# Patient Record
Sex: Female | Born: 1995
Health system: Southern US, Community
[De-identification: ages and names within clinical notes are randomized; demographics above are authoritative.]

## PROBLEM LIST (undated history)

## (undated) DIAGNOSIS — IMO0001 Reserved for inherently not codable concepts without codable children: Secondary | ICD-10-CM

## (undated) DIAGNOSIS — F17200 Nicotine dependence, unspecified, uncomplicated: Secondary | ICD-10-CM

## (undated) DIAGNOSIS — F458 Other somatoform disorders: Secondary | ICD-10-CM

## (undated) HISTORY — DX: Nicotine dependence, unspecified, uncomplicated: F17.200

## (undated) HISTORY — PX: NO PAST SURGERIES: SHX2092

## (undated) HISTORY — DX: Other somatoform disorders: F45.8

## (undated) HISTORY — DX: Reserved for inherently not codable concepts without codable children: IMO0001

---
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
## 2016-02-22 DIAGNOSIS — R197 Diarrhea, unspecified: Secondary | ICD-10-CM | POA: Diagnosis not present

## 2016-02-22 DIAGNOSIS — R1084 Generalized abdominal pain: Secondary | ICD-10-CM | POA: Diagnosis not present

## 2016-03-09 ENCOUNTER — Ambulatory Visit (INDEPENDENT_AMBULATORY_CARE_PROVIDER_SITE_OTHER): Payer: Self-pay | Admitting: Internal Medicine

## 2016-03-09 ENCOUNTER — Encounter: Payer: Self-pay | Admitting: Internal Medicine

## 2016-03-09 VITALS — BP 118/86 | HR 86 | Resp 16 | Ht 67.0 in | Wt 147.0 lb

## 2016-03-09 DIAGNOSIS — F17200 Nicotine dependence, unspecified, uncomplicated: Secondary | ICD-10-CM

## 2016-03-09 DIAGNOSIS — K589 Irritable bowel syndrome without diarrhea: Secondary | ICD-10-CM

## 2016-03-09 NOTE — Progress Notes (Signed)
    Date:  03/09/2016   Name:  Tammy Pena   DOB:  08/17/1996   MRN:  [id number]   Chief Complaint: Establish Care Diarrhea  This is a chronic problem. The current episode started more than 1 year ago (many years). The problem has been waxing and waning. Associated symptoms include bloating and increased flatus. Pertinent negatives include no abdominal pain, chills, coughing, fever or weight loss. Exacerbated by: beef.  She thinks she had IBS as symptoms have been occuring for years.  [facility] but has not received the results.  Review of Systems  Constitutional: Negative for fever, chills, weight loss, diaphoresis and unexpected weight change.  Respiratory: Negative for cough, chest tightness, shortness of breath and wheezing.   Cardiovascular: Negative for chest pain, palpitations and leg swelling.  Gastrointestinal: Positive for diarrhea, abdominal distention, bloating and flatus. Negative for abdominal pain and blood in stool.    There are no active problems to display for this patient.   Prior to Admission medications   Medication Sig Start Date End Date Taking? Authorizing Provider  Norgestimate-Ethinyl Estradiol Triphasic (TRI-SPRINTEC) 0.18/0.215/0.25 MG-35 MCG tablet Take 1 tablet by mouth daily.   Yes Historical Provider, MD    No Known Allergies  History reviewed. No pertinent past surgical history.  Social History  Substance Use Topics  . Smoking status: Current Every Day Smoker -- 0.25 packs/day    Types: Cigarettes  . Smokeless tobacco: None  . Alcohol Use: No     Medication list has been reviewed and updated.   Physical Exam  Constitutional: She is oriented to person, place, and time. She appears well-developed. No distress.  HENT:  Head: Normocephalic and atraumatic.  Neck: Normal range of motion. Neck supple. No thyromegaly present.  Cardiovascular: Normal rate, regular rhythm and normal heart sounds.     Pulmonary/Chest: Effort normal and breath sounds normal. No respiratory distress. She has no wheezes.  Abdominal: Soft. Bowel sounds are normal. She exhibits no distension and no mass. There is no tenderness. There is no rebound and no guarding.  Musculoskeletal: Normal range of motion. She exhibits no edema or tenderness.  Neurological: She is alert and oriented to person, place, and time.  Skin: Skin is warm and dry. No rash noted.  Psychiatric: She has a normal mood and affect. Her behavior is normal. Thought content normal.  Nursing note and vitals reviewed.   BP 118/86 mmHg  Pulse 86  Resp 16  Ht 5\' 7"  (1.702 m)  Wt 147 lb (66.679 kg)  BMI 23.02 kg/m2  SpO2 100%  LMP 02/26/2016  Assessment and Plan: 1. Tobacco use disorder She is not ready to quit at this time due to multiple recent stressors  2. Irritable bowel syndrome (IBS) Forward labs to me for review Try daily fiber supplement to help control symptoms   Tammy EdwardLaura Raneem Mendolia, MD [facility]  03/09/2016

## 2016-03-09 NOTE — Patient Instructions (Signed)
Metamucil or Citrucel - (smooth formula)

## 2016-05-18 ENCOUNTER — Encounter: Payer: Self-pay | Admitting: Internal Medicine

## 2016-05-18 ENCOUNTER — Ambulatory Visit (INDEPENDENT_AMBULATORY_CARE_PROVIDER_SITE_OTHER): Payer: BLUE CROSS/BLUE SHIELD | Admitting: Internal Medicine

## 2016-05-18 VITALS — BP 112/68 | HR 84 | Resp 16 | Ht 67.0 in | Wt 143.0 lb

## 2016-05-18 DIAGNOSIS — K589 Irritable bowel syndrome without diarrhea: Secondary | ICD-10-CM

## 2016-05-18 DIAGNOSIS — Z124 Encounter for screening for malignant neoplasm of cervix: Secondary | ICD-10-CM

## 2016-05-18 DIAGNOSIS — Z720 Tobacco use: Secondary | ICD-10-CM | POA: Diagnosis not present

## 2016-05-18 DIAGNOSIS — Z0000 Encounter for general adult medical examination without abnormal findings: Secondary | ICD-10-CM | POA: Diagnosis not present

## 2016-05-18 DIAGNOSIS — F17200 Nicotine dependence, unspecified, uncomplicated: Secondary | ICD-10-CM | POA: Insufficient documentation

## 2016-05-18 LAB — POCT URINALYSIS DIPSTICK
Bilirubin, UA: NEGATIVE
Blood, UA: NEGATIVE
Glucose, UA: NEGATIVE
Ketones, UA: NEGATIVE
Leukocytes, UA: NEGATIVE
Nitrite, UA: NEGATIVE
Protein, UA: NEGATIVE
Spec Grav, UA: 1.01
pH, UA: 7.5

## 2016-05-18 NOTE — Patient Instructions (Signed)
Breast Self-Awareness Practicing breast self-awareness may pick up problems early, prevent significant medical complications, and possibly save your life. By practicing breast self-awareness, you can become familiar with how your breasts look and feel and if your breasts are changing. This allows you to notice changes early. It can also offer you some reassurance that your breast health is good. One way to learn what is normal for your breasts and whether your breasts are changing is to do a breast self-exam. If you find a lump or something that was not present in the past, it is best to contact your caregiver right away. Other findings that should be evaluated by your caregiver include nipple discharge, especially if it is bloody; skin changes or reddening; areas where the skin seems to be pulled in (retracted); or new lumps and bumps. Breast pain is seldom associated with cancer (malignancy), but should also be evaluated by a caregiver. HOW TO PERFORM A BREAST SELF-EXAM The best time to examine your breasts is 5-7 days after your menstrual period is over. During menstruation, the breasts are lumpier, and it may be more difficult to pick up changes. If you do not menstruate, have reached menopause, or had your uterus removed (hysterectomy), you should examine your breasts at regular intervals, such as monthly. If you are breastfeeding, examine your breasts after a feeding or after using a breast pump. Breast implants do not decrease the risk for lumps or tumors, so continue to perform breast self-exams as recommended. Talk to your caregiver about how to determine the difference between the implant and breast tissue. Also, talk about the amount of pressure you should use during the exam. Over time, you will become more familiar with the variations of your breasts and more comfortable with the exam. A breast self-exam requires you to remove all your clothes above the waist. 1. Look at your breasts and nipples.  Stand in front of a mirror in a room with good lighting. With your hands on your hips, push your hands firmly downward. Look for a difference in shape, contour, and size from one breast to the other (asymmetry). Asymmetry includes puckers, dips, or bumps. Also, look for skin changes, such as reddened or scaly areas on the breasts. Look for nipple changes, such as discharge, dimpling, repositioning, or redness. 2. Carefully feel your breasts. This is best done either in the shower or tub while using soapy water or when flat on your back. Place the arm (on the side of the breast you are examining) above your head. Use the pads (not the fingertips) of your three middle fingers on your opposite hand to feel your breasts. Start in the underarm area and use  inch (2 cm) overlapping circles to feel your breast. Use 3 different levels of pressure (light, medium, and firm pressure) at each circle before moving to the next circle. The light pressure is needed to feel the tissue closest to the skin. The medium pressure will help to feel breast tissue a little deeper, while the firm pressure is needed to feel the tissue close to the ribs. Continue the overlapping circles, moving downward over the breast until you feel your ribs below your breast. Then, move one finger-width towards the center of the body. Continue to use the  inch (2 cm) overlapping circles to feel your breast as you move slowly up toward the collar bone (clavicle) near the base of the neck. Continue the up and down exam using all 3 pressures until you reach the   middle of the chest. Do this with each breast, carefully feeling for lumps or changes. 3.  Keep a written record with breast changes or normal findings for each breast. By writing this information down, you do not need to depend only on memory for size, tenderness, or location. Write down where you are in your menstrual cycle, if you are still menstruating. Breast tissue can have some lumps or  thick tissue. However, see your caregiver if you find anything that concerns you.  SEEK MEDICAL CARE IF:  You see a change in shape, contour, or size of your breasts or nipples.   You see skin changes, such as reddened or scaly areas on the breasts or nipples.   You have an unusual discharge from your nipples.   You feel a new lump or unusually thick areas.    This information is not intended to replace advice given to you by your health care provider. Make sure you discuss any questions you have with your health care provider.   Document Released: 09/17/2005 Document Revised: 09/03/2012 Document Reviewed: 01/02/2012 Elsevier Interactive Patient Education 2016 [employer]  

## 2016-05-18 NOTE — Progress Notes (Signed)
Date:  05/18/2016   Name:  Tammy GhaziDanielle L Pena   DOB:  08/11/1996   MRN:  [id number]   Chief Complaint: Annual Exam (Pap aldo since can not reach GYn)  Tammy Pena is a 20 y.o. female who presents today for her Complete Annual Exam. She feels well. She reports exercising regularly. She reports she is sleeping well. No breast problems.  Review of Systems  Constitutional: Negative for chills, fatigue and fever.  HENT: Negative for congestion, hearing loss, tinnitus, trouble swallowing and voice change.   Eyes: Negative for visual disturbance.  Respiratory: Negative for cough, chest tightness, shortness of breath and wheezing.   Cardiovascular: Negative for chest pain, palpitations and leg swelling.  Gastrointestinal: Negative for abdominal pain, constipation, diarrhea and vomiting.  Endocrine: Negative for polydipsia and polyuria.  Genitourinary: Negative for dysuria, frequency, genital sores, vaginal bleeding and vaginal discharge.  Musculoskeletal: Negative for arthralgias, gait problem and joint swelling.  Skin: Negative for color change and rash.  Neurological: Negative for dizziness, tremors, light-headedness and headaches.  Hematological: Negative for adenopathy. Does not bruise/bleed easily.  Psychiatric/Behavioral: Negative for dysphoric mood and sleep disturbance. The patient is not nervous/anxious.     Patient Active Problem List   Diagnosis Date Noted  . Irritable bowel syndrome (IBS) 05/18/2016  . Smoker 05/18/2016    Prior to Admission medications   Medication Sig Start Date End Date Taking? Authorizing Provider  Norgestimate-Ethinyl Estradiol Triphasic (TRI-SPRINTEC) 0.18/0.215/0.25 MG-35 MCG tablet Take 1 tablet by mouth daily.   Yes Historical Provider, MD    No Known Allergies  History reviewed. No pertinent surgical history.  Social History  Substance Use Topics  . Smoking status: Current Every Day Smoker    Packs/day: 0.25    Years: 5.00    Types:  Cigarettes  . Smokeless tobacco: Never Used  . Alcohol use No     Medication list has been reviewed and updated.   Physical Exam  Constitutional: She is oriented to person, place, and time. She appears well-developed and well-nourished. No distress.  HENT:  Head: Normocephalic and atraumatic.  Right Ear: Tympanic membrane and ear canal normal.  Left Ear: Tympanic membrane and ear canal normal.  Nose: Right sinus exhibits no maxillary sinus tenderness. Left sinus exhibits no maxillary sinus tenderness.  Mouth/Throat: Uvula is midline and oropharynx is clear and moist.  Eyes: Conjunctivae and EOM are normal. Right eye exhibits no discharge. Left eye exhibits no discharge. No scleral icterus.  Neck: Normal range of motion. Carotid bruit is not present. No erythema present. No thyromegaly present.  Cardiovascular: Normal rate, regular rhythm, normal heart sounds and normal pulses.   Pulmonary/Chest: Effort normal. No respiratory distress. She has no wheezes. Right breast exhibits no mass, no nipple discharge, no skin change and no tenderness. Left breast exhibits no mass, no nipple discharge, no skin change and no tenderness.  Abdominal: Soft. Bowel sounds are normal. There is no hepatosplenomegaly. There is no tenderness. There is no CVA tenderness.  Genitourinary: Vagina normal. There is no tenderness, lesion or injury on the right labia. There is no tenderness, lesion or injury on the left labia. Cervix exhibits friability. Cervix exhibits no motion tenderness and no discharge. Right adnexum displays no mass, no tenderness and no fullness. Left adnexum displays no mass, no tenderness and no fullness.  Musculoskeletal: Normal range of motion.  Lymphadenopathy:    She has no cervical adenopathy.    She has no axillary adenopathy.  Neurological: She is alert  and oriented to person, place, and time. She has normal reflexes. No cranial nerve deficit or sensory deficit.  Skin: Skin is warm, dry  and intact. No rash noted.  Psychiatric: She has a normal mood and affect. Her speech is normal and behavior is normal. Thought content normal.  Nursing note and vitals reviewed.   BP 112/68 (BP Location: Right Arm, Patient Position: Sitting, Cuff Size: Normal)   Pulse 84   Resp 16   Ht 5\' 7"  (1.702 m)   Wt 143 lb (64.9 kg)   LMP 05/04/2016   BMI 22.40 kg/m   Assessment and Plan: 1. Annual physical exam Instructed in self breast exam - Lipid panel - TSH - POCT urinalysis dipstick  2. Papanicolaou smear for cervical cancer screening - Pap IG (Image Guided)  3. Irritable bowel syndrome (IBS) Continue high fiber diet - CBC with Differential/Platelet - Comprehensive metabolic panel  4. Smoker - Nurse to provide smoking / tobacco cessation education   Bari EdwardLaura Takeia Ciaravino, MD [facility]  05/18/2016

## 2016-05-19 LAB — CBC WITH DIFFERENTIAL/PLATELET
Basophils Absolute: 0 10*3/uL (ref 0.0–0.2)
Basos: 0 %
EOS (ABSOLUTE): 0.1 10*3/uL (ref 0.0–0.4)
Eos: 1 %
Hematocrit: 39.6 % (ref 34.0–46.6)
Hemoglobin: 12.8 g/dL (ref 11.1–15.9)
Immature Grans (Abs): 0 10*3/uL (ref 0.0–0.1)
Immature Granulocytes: 0 %
Lymphocytes Absolute: 2.1 10*3/uL (ref 0.7–3.1)
Lymphs: 36 %
MCH: 29.2 pg (ref 26.6–33.0)
MCHC: 32.3 g/dL (ref 31.5–35.7)
MCV: 90 fL (ref 79–97)
Monocytes Absolute: 0.3 10*3/uL (ref 0.1–0.9)
Monocytes: 5 %
Neutrophils Absolute: 3.2 10*3/uL (ref 1.4–7.0)
Neutrophils: 58 %
Platelets: 188 10*3/uL (ref 150–379)
RBC: 4.38 x10E6/uL (ref 3.77–5.28)
RDW: 14.4 % (ref 12.3–15.4)
WBC: 5.7 10*3/uL (ref 3.4–10.8)

## 2016-05-19 LAB — COMPREHENSIVE METABOLIC PANEL
ALT: 10 IU/L (ref 0–32)
AST: 16 IU/L (ref 0–40)
Albumin/Globulin Ratio: 2.3 — ABNORMAL HIGH (ref 1.2–2.2)
Albumin: 4.8 g/dL (ref 3.5–5.5)
Alkaline Phosphatase: 52 IU/L (ref 39–117)
BUN/Creatinine Ratio: 15 (ref 9–23)
BUN: 13 mg/dL (ref 6–20)
Bilirubin Total: 0.9 mg/dL (ref 0.0–1.2)
CO2: 24 mmol/L (ref 18–29)
Calcium: 9.8 mg/dL (ref 8.7–10.2)
Chloride: 103 mmol/L (ref 96–106)
Creatinine, Ser: 0.85 mg/dL (ref 0.57–1.00)
GFR calc Af Amer: 113 mL/min/{1.73_m2} (ref >59)
GFR calc non Af Amer: 98 mL/min/{1.73_m2} (ref >59)
Globulin, Total: 2.1 g/dL (ref 1.5–4.5)
Glucose: 73 mg/dL (ref 65–99)
Potassium: 4.2 mmol/L (ref 3.5–5.2)
Sodium: 141 mmol/L (ref 134–144)
Total Protein: 6.9 g/dL (ref 6.0–8.5)

## 2016-05-19 LAB — LIPID PANEL
Chol/HDL Ratio: 2.1 ratio units (ref 0.0–4.4)
Cholesterol, Total: 160 mg/dL (ref 100–199)
HDL: 75 mg/dL (ref >39)
LDL Calculated: 71 mg/dL (ref 0–99)
Triglycerides: 72 mg/dL (ref 0–149)
VLDL Cholesterol Cal: 14 mg/dL (ref 5–40)

## 2016-05-19 LAB — TSH: TSH: 0.954 u[IU]/mL (ref 0.450–4.500)

## 2016-05-22 LAB — PAP IG (IMAGE GUIDED): PAP Smear Comment: 0

## 2016-05-29 ENCOUNTER — Other Ambulatory Visit: Payer: Self-pay | Admitting: Internal Medicine

## 2016-05-29 MED ORDER — NORGESTIM-ETH ESTRAD TRIPHASIC 0.18/0.215/0.25 MG-35 MCG PO TABS: 1.0000 | ORAL_TABLET | Freq: Every day | ORAL | 12 refills | Status: DC

## 2016-06-20 ENCOUNTER — Ambulatory Visit (INDEPENDENT_AMBULATORY_CARE_PROVIDER_SITE_OTHER): Payer: BLUE CROSS/BLUE SHIELD | Admitting: Internal Medicine

## 2016-06-20 ENCOUNTER — Encounter: Payer: Self-pay | Admitting: Internal Medicine

## 2016-06-20 VITALS — BP 122/78 | HR 76 | Resp 16 | Ht 67.0 in | Wt 138.0 lb

## 2016-06-20 DIAGNOSIS — F418 Other specified anxiety disorders: Secondary | ICD-10-CM

## 2016-06-20 DIAGNOSIS — K589 Irritable bowel syndrome without diarrhea: Secondary | ICD-10-CM

## 2016-06-20 MED ORDER — ALPRAZOLAM 0.25 MG PO TABS: 0.2500 mg | ORAL_TABLET | Freq: Two times a day (BID) | ORAL | 0 refills | Status: DC | PRN

## 2016-06-20 MED ORDER — ESCITALOPRAM OXALATE 10 MG PO TABS: 10.0000 mg | ORAL_TABLET | Freq: Every day | ORAL | 1 refills | Status: DC

## 2016-06-20 NOTE — Patient Instructions (Addendum)
Imodium 2 mg - take up to three times per day for diarrhea

## 2016-06-20 NOTE — Progress Notes (Signed)
Date:  06/20/2016   Name:  Tammy Pena   DOB:  1996/06/06   MRN:  [id number]   Chief Complaint: Irritable Bowel Syndrome (Has had increased anxiety and thiks it is causing severe flare up. )  Panic attacks - 2 in the last few days. Increasing over the past few months.  Worry over uncle dying of cancer.  Trying to care for his children.  Not sleeping.  Crying. Decreased appetite. Symptoms of heart racing, trouble breathing and sweating.  No syncope.  Usually resolves after a few minutes.  Depression         This is a new problem.  The onset quality is gradual.   The problem has been gradually worsening since onset.  Associated symptoms include insomnia, restlessness, decreased interest, appetite change, sad and suicidal ideas.  Associated symptoms include no fatigue and no headaches.     The symptoms are aggravated by social issues and family issues.  Past treatments include nothing.  IBS - long hx of mild intermittent sx.  Worsening with diarrhea and cramping over the past few days.  No blood or mucus in stool.  Does not wake from sleep.  No vomiting but decreased appetite.  Has lost about 9 lbs in the past few months.  Review of Systems  Constitutional: Positive for appetite change and unexpected weight change. Negative for diaphoresis, fatigue and fever.  HENT: Negative for trouble swallowing.   Eyes: Negative for visual disturbance.  Respiratory: Negative for chest tightness, shortness of breath and wheezing.   Cardiovascular: Negative for chest pain, palpitations and leg swelling.  Gastrointestinal: Positive for abdominal pain and diarrhea. Negative for blood in stool and vomiting.  Genitourinary: Negative for dysuria and pelvic pain.  Musculoskeletal: Negative for arthralgias.  Skin: Negative for color change and rash.  Neurological: Negative for dizziness, syncope and headaches.  Hematological: Negative for adenopathy.  Psychiatric/Behavioral: Positive for depression, dysphoric  mood, sleep disturbance and suicidal ideas. Negative for self-injury. The patient is nervous/anxious and has insomnia.     Patient Active Problem List   Diagnosis Date Noted  . Irritable bowel syndrome (IBS) 05/18/2016  . Smoker 05/18/2016    Prior to Admission medications   Medication Sig Start Date End Date Taking? Authorizing Provider  Norgestimate-Ethinyl Estradiol Triphasic (TRI-SPRINTEC) 0.18/0.215/0.25 MG-35 MCG tablet Take 1 tablet by mouth daily. 05/29/16  Yes Reubin Milan, MD    No Known Allergies  History reviewed. No pertinent surgical history.  Social History  Substance Use Topics  . Smoking status: Current Every Day Smoker    Packs/day: 0.25    Years: 5.00    Types: Cigarettes  . Smokeless tobacco: Never Used  . Alcohol use No     Medication list has been reviewed and updated.   Physical Exam  Constitutional: She appears well-nourished.  Cardiovascular: Normal rate, regular rhythm and normal heart sounds.   Pulmonary/Chest: Effort normal and breath sounds normal.  Abdominal: Soft. Bowel sounds are decreased. There is no hepatosplenomegaly. There is generalized tenderness. There is no rigidity, no rebound and no guarding.  Psychiatric: Her speech is normal. Her mood appears anxious. She exhibits a depressed mood.  Alert, appropriate. No suicidal plan or intent.    BP 122/78   Pulse 76   Resp 16   Ht 5\' 7"  (1.702 m)   Wt 138 lb (62.6 kg)   LMP 06/06/2016   BMI 21.61 kg/m   Assessment and Plan: 1. Irritable bowel syndrome (IBS) No red flag  sx - begin imodium PRN  2. Depression with anxiety Recommend counseling - pt to call insurance Instructed to go to ER if worsening suicidal ideation or intent Use alprazolam prn severe panic sx - escitalopram (LEXAPRO) 10 MG tablet; Take 1 tablet (10 mg total) by mouth daily.  Dispense: 30 tablet; Refill: 1 - ALPRAZolam (XANAX) 0.25 MG tablet; Take 1 tablet (0.25 mg total) by mouth 2 (two) times daily as  needed for anxiety.  Dispense: 15 tablet; Refill: 0   Bari EdwardLaura Damarkus Balis, MD [facility]  06/20/2016

## 2016-07-18 ENCOUNTER — Ambulatory Visit: Payer: BLUE CROSS/BLUE SHIELD | Admitting: Internal Medicine

## 2016-09-27 DIAGNOSIS — R05 Cough: Secondary | ICD-10-CM | POA: Diagnosis not present

## 2016-09-27 DIAGNOSIS — J069 Acute upper respiratory infection, unspecified: Secondary | ICD-10-CM | POA: Diagnosis not present

## 2016-10-06 DIAGNOSIS — R509 Fever, unspecified: Secondary | ICD-10-CM | POA: Diagnosis not present

## 2016-10-06 DIAGNOSIS — J101 Influenza due to other identified influenza virus with other respiratory manifestations: Secondary | ICD-10-CM | POA: Diagnosis not present

## 2016-11-02 ENCOUNTER — Encounter: Payer: Self-pay | Admitting: Internal Medicine

## 2016-11-02 ENCOUNTER — Ambulatory Visit (INDEPENDENT_AMBULATORY_CARE_PROVIDER_SITE_OTHER): Payer: BLUE CROSS/BLUE SHIELD | Admitting: Internal Medicine

## 2016-11-02 VITALS — BP 122/78 | HR 82 | Temp 98.2°F | Ht 67.0 in | Wt 142.0 lb

## 2016-11-02 DIAGNOSIS — N926 Irregular menstruation, unspecified: Secondary | ICD-10-CM | POA: Diagnosis not present

## 2016-11-02 DIAGNOSIS — R300 Dysuria: Secondary | ICD-10-CM

## 2016-11-02 LAB — POCT URINALYSIS DIPSTICK
Bilirubin, UA: NEGATIVE
Glucose, UA: NEGATIVE
Ketones, UA: NEGATIVE
Nitrite, UA: NEGATIVE
Protein, UA: NEGATIVE
Spec Grav, UA: >=1.03
Urobilinogen, UA: 0.2
pH, UA: 5

## 2016-11-02 MED ORDER — SULFAMETHOXAZOLE-TRIMETHOPRIM 800-160 MG PO TABS: 1.0000 | ORAL_TABLET | Freq: Two times a day (BID) | ORAL | 0 refills | Status: DC

## 2016-11-02 NOTE — Progress Notes (Signed)
    Date:  11/02/2016   Name:  Tammy Pena   DOB:  09/14/1996   MRN:  [id number]   Chief Complaint: Urinary Tract Infection (pt stated having cramping, bleeding, burning) Urinary Tract Infection   This is a new problem. The current episode started yesterday. The problem occurs every urination. The problem has been unchanged. The quality of the pain is described as burning. The pain is mild. There has been no fever. She is sexually active. There is no history of pyelonephritis. Associated symptoms include frequency. Pertinent negatives include no chills, flank pain or hematuria.   Menstrual irregularity - over the past few years has had trouble regulating menses.  OCPs will work well for several months then she will have continuous spotting through the month.  This is happening again but cant get into GYN for several weeks.   Review of Systems  Constitutional: Negative for chills, fatigue and fever.  Respiratory: Negative for chest tightness, shortness of breath and wheezing.   Cardiovascular: Negative for chest pain and palpitations.  Gastrointestinal: Negative for abdominal pain and blood in stool.  Genitourinary: Positive for dysuria, frequency, menstrual problem and vaginal bleeding. Negative for flank pain, hematuria, pelvic pain and vaginal pain.    Patient Active Problem List   Diagnosis Date Noted  . Irritable bowel syndrome (IBS) 05/18/2016  . Smoker 05/18/2016    Prior to Admission medications   Medication Sig Start Date End Date Taking? Authorizing Provider  ALPRAZolam (XANAX) 0.25 MG tablet Take 1 tablet (0.25 mg total) by mouth 2 (two) times daily as needed for anxiety. 06/20/16   Reubin MilanLaura H Keilah Lemire, MD  escitalopram (LEXAPRO) 10 MG tablet Take 1 tablet (10 mg total) by mouth daily. 06/20/16   Reubin MilanLaura H Derrel Moore, MD  Norgestimate-Ethinyl Estradiol Triphasic (TRI-SPRINTEC) 0.18/0.215/0.25 MG-35 MCG tablet Take 1 tablet by mouth daily. 05/29/16   Reubin MilanLaura H Shrey Boike, MD    No Known  Allergies  No past surgical history on file.  Social History  Substance Use Topics  . Smoking status: Current Every Day Smoker    Packs/day: 0.25    Years: 5.00    Types: Cigarettes  . Smokeless tobacco: Never Used  . Alcohol use No     Medication list has been reviewed and updated.   Physical Exam  Constitutional: She is oriented to person, place, and time. She appears well-developed. No distress.  HENT:  Head: Normocephalic and atraumatic.  Cardiovascular: Normal rate, regular rhythm and normal heart sounds.   Pulmonary/Chest: Effort normal and breath sounds normal. No respiratory distress.  Abdominal: Soft. There is tenderness in the suprapubic area. There is no CVA tenderness.  Musculoskeletal: Normal range of motion.  Neurological: She is alert and oriented to person, place, and time.  Skin: Skin is warm and dry. No rash noted.  Psychiatric: She has a normal mood and affect. Her behavior is normal. Thought content normal.  Nursing note and vitals reviewed.   BP 122/78   Pulse 82   Temp 98.2 F (36.8 C)   Ht 5\' 7"  (1.702 m)   Wt 142 lb (64.4 kg)   SpO2 98%   BMI 22.24 kg/m   Assessment and Plan: 1. Dysuria Increase fluids - POCT urinalysis dipstick - sulfamethoxazole-trimethoprim (BACTRIM DS,SEPTRA DS) 800-160 MG tablet; Take 1 tablet by mouth 2 (two) times daily.  Dispense: 14 tablet; Refill: 0  2. Menstrual irregularity Recommend GYN consultation   Bari EdwardLaura Boyd Litaker, MD [facility]  11/02/2016

## 2016-12-06 ENCOUNTER — Telehealth: Payer: Self-pay

## 2016-12-06 NOTE — Telephone Encounter (Signed)
Pt called left Vm stating that she was having heavy menstrual bleeding, and painful cramps. I called pt back and pt said this has been going on for 3 months, and is not a new problem. Pt is on a birth control pill and began taking in 05/2016. Instructed pt she needs to see OB doctor. She stated she awaiting call back from office. If they cannot see her by next week she wants to be seen with Doctor Judithann GravesBerglund for some relief.

## 2016-12-10 ENCOUNTER — Ambulatory Visit (INDEPENDENT_AMBULATORY_CARE_PROVIDER_SITE_OTHER): Payer: BLUE CROSS/BLUE SHIELD | Admitting: Obstetrics and Gynecology

## 2016-12-10 ENCOUNTER — Encounter: Payer: Self-pay | Admitting: Obstetrics and Gynecology

## 2016-12-10 VITALS — BP 116/68 | HR 96 | Ht 67.0 in | Wt 141.0 lb

## 2016-12-10 DIAGNOSIS — Z30011 Encounter for initial prescription of contraceptive pills: Secondary | ICD-10-CM | POA: Diagnosis not present

## 2016-12-10 DIAGNOSIS — R58 Hemorrhage, not elsewhere classified: Secondary | ICD-10-CM

## 2016-12-10 DIAGNOSIS — N921 Excessive and frequent menstruation with irregular cycle: Secondary | ICD-10-CM | POA: Diagnosis not present

## 2016-12-10 LAB — POCT URINE PREGNANCY: Preg Test, Ur: NEGATIVE

## 2016-12-10 MED ORDER — DESOGESTREL-ETHINYL ESTRADIOL 0.15-30 MG-MCG PO TABS: 1.0000 | ORAL_TABLET | Freq: Every day | ORAL | 0 refills | Status: DC

## 2016-12-10 NOTE — Progress Notes (Signed)
    HPI:      Ms. Eilene GhaziDanielle L Koehl is a 21 y.o. G0P0000 who LMP was 2 wks ago, presents today for irregular bleeding on OCPs. Menses are usually monthly, lasting 6 days. She had BTB for the past 3 pill packs without late /missed pills. She stopped her pills about 2 wks ago. She has had similar sx in the past and changed from junel 1.5/30 to orthocyclen.  She has not been sex active in the past 2 months.   Past Medical History:  Diagnosis Date  . Birth control   . Nervous stomach    no red meat   . Smoker     Past Surgical History:  Procedure Laterality Date  . NO PAST SURGERIES      Family History  Problem Relation Age of Onset  . Cancer Father   . Diabetes Maternal Aunt   . Cancer Paternal Uncle   . Diabetes Maternal Grandmother   . Cancer Cousin   . Cancer Cousin   . Cancer Other      ROS:  Review of Systems  Constitutional: Negative for fever, malaise/fatigue and weight loss.  Gastrointestinal: Negative for blood in stool, constipation, diarrhea, nausea and vomiting.  Genitourinary: Negative for dysuria, flank pain, frequency, hematuria and urgency.  Musculoskeletal: Negative for back pain.  Skin: Negative for itching and rash.    Objective: BP 116/68 (Patient Position: Sitting)   Pulse 96   Ht 5\' 7"  (1.702 m)   Wt 141 lb (64 kg)   LMP  (LMP Unknown) Comment: continuous bleeding x 3 months  BMI 22.08 kg/m    Physical Exam  Genitourinary: There is no rash, tenderness, lesion or Bartholin's cyst on the right labia. There is no rash, tenderness, lesion or Bartholin's cyst on the left labia. No erythema, tenderness or bleeding in the vagina. No vaginal discharge found. Right adnexum does not display mass and does not display tenderness. Left adnexum does not display mass and does not display tenderness. Cervix does not exhibit motion tenderness, discharge or friability. Uterus is not enlarged or tender.  Nursing note and vitals reviewed.   Results: Results for  orders placed or performed in visit on 12/10/16 (from the past 24 hour(s))  POCT urine pregnancy     Status: Normal   Collection Time: 12/10/16 10:15 AM  Result Value Ref Range   Preg Test, Ur Negative Negative    Assessment/Plan:  Bleeding - Plan: POCT urine pregnancy  Encounter for initial prescription of contraceptive pills - OCP restart today. Neg UPT. No sex activity for 2 months. - Plan: desogestrel-ethinyl estradiol (APRI) 0.15-30 MG-MCG tablet  Breakthrough bleeding on birth control pills - Check gon/chlam. Change OCPs. F/u at annual this month. - Plan: Chlamydia/Gonococcus/Trichomonas, NAA     F/U  Return if symptoms worsen or fail to improve.  Virgia Kelner B. Svara Twyman, PA-C 12/10/2016 10:25 AM

## 2016-12-12 LAB — CHLAMYDIA/GONOCOCCUS/TRICHOMONAS, NAA
Chlamydia by NAA: NEGATIVE
Gonococcus by NAA: NEGATIVE
Trich vag by NAA: NEGATIVE

## 2016-12-18 ENCOUNTER — Encounter: Payer: Self-pay | Admitting: Obstetrics & Gynecology

## 2016-12-18 ENCOUNTER — Ambulatory Visit (INDEPENDENT_AMBULATORY_CARE_PROVIDER_SITE_OTHER): Payer: BLUE CROSS/BLUE SHIELD | Admitting: Obstetrics & Gynecology

## 2016-12-18 VITALS — BP 120/70 | HR 70 | Ht 67.0 in | Wt 139.0 lb

## 2016-12-18 DIAGNOSIS — Z0000 Encounter for general adult medical examination without abnormal findings: Secondary | ICD-10-CM

## 2016-12-18 DIAGNOSIS — Z30011 Encounter for initial prescription of contraceptive pills: Secondary | ICD-10-CM | POA: Diagnosis not present

## 2016-12-18 MED ORDER — LEVONORGEST-ETH ESTRAD 91-DAY 0.15-0.03 MG PO TABS: 1.0000 | ORAL_TABLET | Freq: Every day | ORAL | 4 refills | Status: DC

## 2016-12-18 NOTE — Progress Notes (Signed)
HPI:      [name] is a 21 y.o. G0P0000 who LMP was Patient's last menstrual period was 12/04/2016., she presents today for her annual examination. The patient has no complaints today. The patient is sexually active.   no prior history of gyn screening tests  The patient has regular exercise: yes.    GYN History: Contraception: OCP (estrogen/progesterone)  PMHx: She  has a past medical history of Birth control; Nervous stomach; and Smoker. Also,  has a past surgical history that includes No past surgeries., family history includes Cancer in her cousin, cousin, father, other, and paternal uncle; Diabetes in her maternal aunt and maternal grandmother.,  reports that she has been smoking Cigarettes.  She has a 1.25 pack-year smoking history. She has never used smokeless tobacco. She reports that she does not drink alcohol or use drugs.  She has a current medication list which includes the following prescription(s): levonorgestrel-ethinyl estradiol. Also, has No Known Allergies.  Review of Systems  Constitutional: Negative for chills, fever and malaise/fatigue.  HENT: Negative for congestion, sinus pain and sore throat.   Eyes: Negative for blurred vision and pain.  Respiratory: Negative for cough and wheezing.   Cardiovascular: Negative for chest pain and leg swelling.  Gastrointestinal: Negative for abdominal pain, constipation, diarrhea, heartburn, nausea and vomiting.  Genitourinary: Negative for dysuria, frequency, hematuria and urgency.  Musculoskeletal: Negative for back pain, joint pain, myalgias and neck pain.  Skin: Negative for itching and rash.  Neurological: Negative for dizziness, tremors and weakness.  Endo/Heme/Allergies: Does not bruise/bleed easily.  Psychiatric/Behavioral: Negative for depression. The patient is not nervous/anxious and does not have insomnia.     Objective: BP 120/70   Pulse 70   Ht 5\' 7"  (1.702 m)   Wt 139 lb (63 kg)   LMP 12/04/2016  Comment: continuous bleeding x 3 months  BMI 21.77 kg/m  Physical Exam  Constitutional: She is oriented to person, place, and time. She appears well-developed and well-nourished. No distress.  Genitourinary: Rectum normal, vagina normal and uterus normal. Pelvic exam was performed with patient supine. There is no rash or lesion on the right labia. There is no rash or lesion on the left labia. Vagina exhibits no lesion. No bleeding in the vagina. Right adnexum does not display mass and does not display tenderness. Left adnexum does not display mass and does not display tenderness. Cervix does not exhibit motion tenderness, lesion, friability or polyp.   Uterus is mobile and midaxial. Uterus is not enlarged or exhibiting a mass.  HENT:  Head: Normocephalic and atraumatic. Head is without laceration.  Right Ear: Hearing normal.  Left Ear: Hearing normal.  Nose: No epistaxis.  No foreign bodies.  Mouth/Throat: Uvula is midline, oropharynx is clear and moist and mucous membranes are normal.  Eyes: Pupils are equal, round, and reactive to light.  Neck: Normal range of motion. Neck supple. No thyromegaly present.  Cardiovascular: Normal rate and regular rhythm.  Exam reveals no gallop and no friction rub.   No murmur heard. Pulmonary/Chest: Effort normal and breath sounds normal. No respiratory distress. She has no wheezes.  Abdominal: Soft. Bowel sounds are normal. She exhibits no distension. There is no tenderness. There is no rebound.  Musculoskeletal: Normal range of motion.  Neurological: She is alert and oriented to person, place, and time. No cranial nerve deficit.  Skin: Skin is warm and dry.  Psychiatric: She has a normal mood and affect. Judgment normal.  Vitals reviewed.  Assessment:  ANNUAL EXAM 1. Annual physical exam   2. Encounter for initial prescription of contraceptive pills      Screening Plan:            1.  Cervical Screening-  Pap smear done today  2. Counseling  for contraception: oral contraceptives (estrogen/progesterone) Will change to [employer] generic to space out periods; monitor for BTB. Birth Control I discussed multiple birth control options and methods with the patient.  The risks and benefits of each were reviewed.  The possible side effects including deep venous thrombosis, breast tenderness, fluid retention, mood changes and abnormal vaginal bleeding were discussed.  Combination as well as progesterone-only options, pros and cons counseled.    F/U  Return in about 1 year (around 12/18/2017) for Annual.  Annamarie MajorPaul Lourine Alberico, MD, Merlinda FrederickFACOG [facility], [facility] 12/18/2016  2:54 PM

## 2016-12-18 NOTE — Patient Instructions (Signed)
Oral Contraception Use Oral contraceptive pills (OCPs) are medicines taken to prevent pregnancy. OCPs work by preventing the ovaries from releasing eggs. The hormones in OCPs also cause the cervical mucus to thicken, preventing the sperm from entering the uterus. The hormones also cause the uterine lining to become thin, not allowing a fertilized egg to attach to the inside of the uterus. OCPs are highly effective when taken exactly as prescribed. However, OCPs do not prevent sexually transmitted diseases (STDs). Safe sex practices, such as using condoms along with an OCP, can help prevent STDs. Before taking OCPs, you may have a physical exam and Pap test. Your health care provider may also order blood tests if necessary. Your health care provider will make sure you are a good candidate for oral contraception. Discuss with your health care provider the possible side effects of the OCP you may be prescribed. When starting an OCP, it can take 2 to 3 months for the body to adjust to the changes in hormone levels in your body. How to take oral contraceptive pills Your health care provider may advise you on how to start taking the first cycle of OCPs. Otherwise, you can:  Start on day 1 of your menstrual period. You will not need any backup contraceptive protection with this start time.  Start on the first Sunday after your menstrual period or the day you get your prescription. In these cases, you will need to use backup contraceptive protection for the first week.  Start the pill at any time of your cycle. If you take the pill within 5 days of the start of your period, you are protected against pregnancy right away. In this case, you will not need a backup form of birth control. If you start at any other time of your menstrual cycle, you will need to use another form of birth control for 7 days. If your OCP is the type called a minipill, it will protect you from pregnancy after taking it for 2 days (48  hours).  After you have started taking OCPs:  If you forget to take 1 pill, take it as soon as you remember. Take the next pill at the regular time.  If you miss 2 or more pills, call your health care provider because different pills have different instructions for missed doses. Use backup birth control until your next menstrual period starts.  If you use a 28-day pack that contains inactive pills and you miss 1 of the last 7 pills (pills with no hormones), it will not matter. Throw away the rest of the non-hormone pills and start a new pill pack.  No matter which day you start the OCP, you will always start a new pack on that same day of the week. Have an extra pack of OCPs and a backup contraceptive method available in case you miss some pills or lose your OCP pack. Follow these instructions at home:  Do not smoke.  Always use a condom to protect against STDs. OCPs do not protect against STDs.  Use a calendar to mark your menstrual period days.  Read the information and directions that came with your OCP. Talk to your health care provider if you have questions. Contact a health care provider if:  You develop nausea and vomiting.  You have abnormal vaginal discharge or bleeding.  You develop a rash.  You miss your menstrual period.  You are losing your hair.  You need treatment for mood swings or depression.  You   get dizzy when taking the OCP.  You develop acne from taking the OCP.  You become pregnant. Get help right away if:  You develop chest pain.  You develop shortness of breath.  You have an uncontrolled or severe headache.  You develop numbness or slurred speech.  You develop visual problems.  You develop pain, redness, and swelling in the legs. This information is not intended to replace advice given to you by your health care provider. Make sure you discuss any questions you have with your health care provider. Document Released: 09/06/2011 Document  Revised: 02/23/2016 Document Reviewed: 03/08/2013 Elsevier Interactive Patient Education  2017 Elsevier Inc.  

## 2016-12-20 LAB — IGP,CTNGTV,RFX APTIMA HPV ASCU
Chlamydia, Nuc. Acid Amp: NEGATIVE
Gonococcus, Nuc. Acid Amp: NEGATIVE
PAP Smear Comment: 0
Trich vag by NAA: NEGATIVE

## 2017-01-02 DIAGNOSIS — L508 Other urticaria: Secondary | ICD-10-CM | POA: Diagnosis not present

## 2017-02-28 ENCOUNTER — Ambulatory Visit (INDEPENDENT_AMBULATORY_CARE_PROVIDER_SITE_OTHER): Payer: BLUE CROSS/BLUE SHIELD | Admitting: Obstetrics and Gynecology

## 2017-02-28 ENCOUNTER — Encounter: Payer: Self-pay | Admitting: Obstetrics and Gynecology

## 2017-02-28 VITALS — BP 96/50 | HR 73 | Ht 67.0 in | Wt 137.0 lb

## 2017-02-28 DIAGNOSIS — N760 Acute vaginitis: Secondary | ICD-10-CM

## 2017-02-28 LAB — POCT WET PREP WITH KOH
Clue Cells Wet Prep HPF POC: NEGATIVE
KOH Prep POC: NEGATIVE
Trichomonas, UA: NEGATIVE
Yeast Wet Prep HPF POC: NEGATIVE

## 2017-02-28 MED ORDER — TERCONAZOLE 0.8 % VA CREA: 1.0000 | TOPICAL_CREAM | Freq: Every day | VAGINAL | 1 refills | Status: AC

## 2017-02-28 NOTE — Progress Notes (Signed)
Chief Complaint  Patient presents with  . Contraception    discuss new ocp options    HPI:      Ms. Eilene GhaziDanielle L Etheredge is a 21 y.o. G0P0000 who LMP was Patient's last menstrual period was 02/13/2017., presents today for a yeast infection. She has noticed she is getting yeast vag sx with her new seasonale OCPs, started 4/18. Sx occur at the beginning of each 4 wk segment. She treats with monistat or diflucan from PCP with sx relief, last treated with diflucan 2 wks ago. She noticed itching and burning without increased d/c, with non-fishy, strong odor. No LBP, belly pain, fevers. She uses water to clean and occas dryer sheets. No recent abx use.  She has had occas BTB/spotting with new OCPs and expected this. She likes these OCPs.     Patient Active Problem List   Diagnosis Date Noted  . Menstrual irregularity 11/02/2016  . Irritable bowel syndrome (IBS) 05/18/2016  . Smoker 05/18/2016    Family History  Problem Relation Age of Onset  . Cancer Father   . Diabetes Maternal Aunt   . Cancer Paternal Uncle   . Diabetes Maternal Grandmother   . Cancer Cousin   . Cancer Cousin   . Cancer Other     Social History   Social History  . Marital status: Single    Spouse name: N/A  . Number of children: N/A  . Years of education: N/A   Occupational History  . Not on file.   Social History Main Topics  . Smoking status: Current Every Day Smoker    Packs/day: 0.25    Years: 5.00    Types: Cigarettes  . Smokeless tobacco: Never Used  . Alcohol use No  . Drug use: No  . Sexual activity: Yes    Birth control/ protection: Pill   Other Topics Concern  . Not on file   Social History Narrative   Working full time [employer]     Current Outpatient Prescriptions:  .  levonorgestrel-ethinyl estradiol (SEASONALE,INTROVALE,JOLESSA) 0.15-0.03 MG tablet, Take 1 tablet by mouth daily., Disp: 1 Package, Rfl: 4 .  terconazole (TERAZOL 3) 0.8 % vaginal cream, Place 1 applicator  vaginally at bedtime., Disp: 20 g, Rfl: 1  Review of Systems  Constitutional: Positive for fatigue. Negative for fever.  Gastrointestinal: Negative for blood in stool, constipation, diarrhea, nausea and vomiting.  Genitourinary: Positive for vaginal bleeding and vaginal pain. Negative for dyspareunia, dysuria, flank pain, frequency, hematuria, urgency and vaginal discharge.  Musculoskeletal: Negative for back pain.  Skin: Negative for rash.     OBJECTIVE:   Vitals:  BP (!) 96/50 (BP Location: Left Arm, Patient Position: Sitting, Cuff Size: Normal)   Pulse 73   Ht 5\' 7"  (1.702 m)   Wt 137 lb (62.1 kg)   LMP 02/13/2017   BMI 21.46 kg/m   Physical Exam  Constitutional: She is oriented to person, place, and time and well-developed, well-nourished, and in no distress. Vital signs are normal.  Genitourinary: Vagina normal, uterus normal, cervix normal, right adnexa normal, left adnexa normal and vulva normal. Uterus is not enlarged. Cervix exhibits no motion tenderness and no tenderness. Right adnexum displays no mass and no tenderness. Left adnexum displays no mass and no tenderness. Vulva exhibits no erythema, no exudate, no lesion, no rash and no tenderness. Vagina exhibits no lesion. No vaginal discharge found.  Neurological: She is oriented to person, place, and time.  Vitals reviewed.   Results: Results  for orders placed or performed in visit on 02/28/17 (from the past 24 hour(s))  POCT Wet Prep with KOH     Status: Normal   Collection Time: 02/28/17 12:04 PM  Result Value Ref Range   Trichomonas, UA Negative    Clue Cells Wet Prep HPF POC neg    Epithelial Wet Prep HPF POC  Few, Moderate, Many, Too numerous to count   Yeast Wet Prep HPF POC neg    Bacteria Wet Prep HPF POC  Few   RBC Wet Prep HPF POC     WBC Wet Prep HPF POC     KOH Prep POC Negative Negative     Assessment/Plan: Acute vaginitis - Neg wet prep/exam. Treat empirically with terazol. Line dry underwear/no  dryer sheets. If sx persist, will culture.  - Plan: terconazole (TERAZOL 3) 0.8 % vaginal cream, POCT Wet Prep with KOH    Meds ordered this encounter  Medications  . terconazole (TERAZOL 3) 0.8 % vaginal cream    Sig: Place 1 applicator vaginally at bedtime.    Dispense:  20 g    Refill:  1      Return if symptoms worsen or fail to improve.  Alicia B. Copland, PA-C 02/28/2017 12:05 PM

## 2017-05-14 ENCOUNTER — Ambulatory Visit: Payer: BLUE CROSS/BLUE SHIELD | Admitting: Internal Medicine

## 2017-12-24 ENCOUNTER — Encounter: Payer: Self-pay | Admitting: Internal Medicine

## 2017-12-24 ENCOUNTER — Ambulatory Visit (INDEPENDENT_AMBULATORY_CARE_PROVIDER_SITE_OTHER): Payer: BLUE CROSS/BLUE SHIELD | Admitting: Internal Medicine

## 2017-12-24 VITALS — BP 134/70 | HR 96 | Temp 98.3°F | Ht 67.0 in | Wt 139.0 lb

## 2017-12-24 DIAGNOSIS — J069 Acute upper respiratory infection, unspecified: Secondary | ICD-10-CM

## 2017-12-24 DIAGNOSIS — K581 Irritable bowel syndrome with constipation: Secondary | ICD-10-CM

## 2017-12-24 DIAGNOSIS — F419 Anxiety disorder, unspecified: Secondary | ICD-10-CM | POA: Insufficient documentation

## 2017-12-24 MED ORDER — ALPRAZOLAM 0.25 MG PO TABS: 0.2500 mg | ORAL_TABLET | Freq: Two times a day (BID) | ORAL | 0 refills | Status: DC | PRN

## 2017-12-24 NOTE — Progress Notes (Signed)
Date:  12/24/2017   Name:  Tammy Pena   DOB:  02-27-1996   MRN:  [id number]   Chief Complaint: Abdominal Pain (Always had stomach issues. Constipation or diarrhea. Has stomach ache almost every day now. BM are always abnormal. Can feel pain and ache in lower intestines. BM are never normal. Wants to get referral to GI. Pediatrician in past told her she will eventually need to see GI.  )  Abdominal Pain  This is a chronic problem. The problem occurs constantly. The problem has been gradually worsening. The pain is located in the suprapubic region. The pain is mild. The quality of the pain is aching. Associated symptoms include constipation. Pertinent negatives include no fever, headaches or vomiting. The pain is relieved by bowel movements.  Anxiety  Presents for follow-up visit. Symptoms include hyperventilation, nervous/anxious behavior and shortness of breath. Patient reports no chest pain, dizziness or palpitations. Symptoms occur occasionally (taking alprazolam about twice a week - did not do well on lexapro). The severity of symptoms is moderate. The quality of sleep is good.    URI   This is a new problem. The current episode started in the past 7 days. There has been no fever. Associated symptoms include abdominal pain and a sore throat. Pertinent negatives include no chest pain, coughing, ear pain, headaches, vomiting or wheezing.     Review of Systems  Constitutional: Negative for chills, fatigue and fever.  HENT: Positive for sore throat. Negative for ear pain.   Respiratory: Positive for shortness of breath. Negative for cough, chest tightness and wheezing.   Cardiovascular: Negative for chest pain and palpitations.  Gastrointestinal: Positive for abdominal pain and constipation. Negative for blood in stool, rectal pain and vomiting.  Neurological: Negative for dizziness and headaches.  Psychiatric/Behavioral: The patient is nervous/anxious.     Patient Active Problem  List   Diagnosis Date Noted  . Menstrual irregularity 11/02/2016  . Irritable bowel syndrome (IBS) 05/18/2016  . Smoker 05/18/2016    Prior to Admission medications   Medication Sig Start Date End Date Taking? Authorizing Provider  levonorgestrel-ethinyl estradiol (SEASONALE,INTROVALE,JOLESSA) 0.15-0.03 MG tablet Take 1 tablet by mouth daily. 12/18/16  Yes Nadara Mustard, MD  polyethylene glycol [facility] / GLYCOLAX) packet Take 17 g by mouth daily.   Yes [provider]    No Known Allergies  Past Surgical History:  Procedure Laterality Date  . NO PAST SURGERIES      Social History   Tobacco Use  . Smoking status: Current Every Day Smoker    Packs/day: 0.25    Years: 5.00    Pack years: 1.25    Types: Cigarettes  . Smokeless tobacco: Never Used  Substance Use Topics  . Alcohol use: No    Alcohol/week: 0.0 oz  . Drug use: No     Medication list has been reviewed and updated.  PHQ 2/9 Scores 12/24/2017 05/18/2016 03/09/2016  PHQ - 2 Score 0 0 4  PHQ- 9 Score 0 - 13    Physical Exam  Constitutional: She is oriented to person, place, and time. She appears well-developed. No distress.  HENT:  Head: Normocephalic and atraumatic.  Right Ear: Tympanic membrane and ear canal normal.  Left Ear: Tympanic membrane and ear canal normal.  Nose: Right sinus exhibits no maxillary sinus tenderness. Left sinus exhibits no maxillary sinus tenderness.  Mouth/Throat: No posterior oropharyngeal edema or posterior oropharyngeal erythema.  Neck: Normal range of motion. Neck supple.  Cardiovascular: Normal  rate, regular rhythm and normal heart sounds.  Pulmonary/Chest: Effort normal and breath sounds normal. No respiratory distress.  Abdominal: Soft. Bowel sounds are normal. She exhibits no distension. There is no tenderness. There is no rebound.  Musculoskeletal: Normal range of motion.  Neurological: She is alert and oriented to person, place, and time.  Skin: Skin is warm and  dry. No rash noted.  Psychiatric: She has a normal mood and affect. Her behavior is normal. Thought content normal.  Nursing note and vitals reviewed.   BP 134/70   Pulse 96   Ht 5\' 7"  (1.702 m)   Wt 139 lb (63 kg)   LMP 12/21/2017 (Exact Date)   SpO2 98%   BMI 21.77 kg/m   Assessment and Plan: 1. Irritable bowel syndrome with constipation Samples of Amitiza 8 mcg bid given - Ambulatory referral to Gastroenterology  2. Anxiety Continue PRN low dose xanax - ALPRAZolam (XANAX) 0.25 MG tablet; Take 1 tablet (0.25 mg total) by mouth 2 (two) times daily as needed for anxiety.  Dispense: 15 tablet; Refill: 0  3. Viral URI Supportive treatment with fluids, tylenol  Meds ordered this encounter  Medications  . ALPRAZolam (XANAX) 0.25 MG tablet    Sig: Take 1 tablet (0.25 mg total) by mouth 2 (two) times daily as needed for anxiety.    Dispense:  15 tablet    Refill:  0    Partially dictated using Animal nutritionistDragon software. Any errors are unintentional.  Bari EdwardLaura Juanangel Soderholm, MD [facility]  12/24/2017

## 2017-12-24 NOTE — Patient Instructions (Signed)
Amitiza 8 mcg twice a day with a meal.

## 2018-02-03 ENCOUNTER — Encounter: Payer: Self-pay | Admitting: Gastroenterology

## 2018-02-03 ENCOUNTER — Ambulatory Visit (INDEPENDENT_AMBULATORY_CARE_PROVIDER_SITE_OTHER): Payer: BLUE CROSS/BLUE SHIELD | Admitting: Gastroenterology

## 2018-02-03 ENCOUNTER — Encounter (INDEPENDENT_AMBULATORY_CARE_PROVIDER_SITE_OTHER): Payer: Self-pay

## 2018-02-03 DIAGNOSIS — K581 Irritable bowel syndrome with constipation: Secondary | ICD-10-CM

## 2018-02-03 NOTE — Progress Notes (Signed)
Patient left before being seen due to a previous appointment

## 2018-02-21 ENCOUNTER — Other Ambulatory Visit: Payer: Self-pay | Admitting: Obstetrics & Gynecology

## 2018-02-21 DIAGNOSIS — Z0000 Encounter for general adult medical examination without abnormal findings: Secondary | ICD-10-CM

## 2018-02-21 DIAGNOSIS — Z30011 Encounter for initial prescription of contraceptive pills: Secondary | ICD-10-CM

## 2018-02-26 ENCOUNTER — Ambulatory Visit: Payer: BLUE CROSS/BLUE SHIELD | Admitting: Obstetrics & Gynecology

## 2018-02-26 NOTE — Telephone Encounter (Signed)
Sch annual

## 2018-02-26 NOTE — Telephone Encounter (Signed)
Patient is schedule 02/26/18 with [facility]

## 2018-04-07 ENCOUNTER — Encounter: Payer: Self-pay | Admitting: *Deleted

## 2018-04-07 ENCOUNTER — Ambulatory Visit: Payer: BLUE CROSS/BLUE SHIELD | Admitting: Gastroenterology

## 2018-05-02 ENCOUNTER — Encounter: Payer: Self-pay | Admitting: Maternal Newborn

## 2018-05-02 ENCOUNTER — Ambulatory Visit (INDEPENDENT_AMBULATORY_CARE_PROVIDER_SITE_OTHER): Payer: BLUE CROSS/BLUE SHIELD | Admitting: Maternal Newborn

## 2018-05-02 ENCOUNTER — Other Ambulatory Visit (HOSPITAL_COMMUNITY)
Admission: RE | Admit: 2018-05-02 | Discharge: 2018-05-02 | Disposition: A | Discharge status: Home / Self Care | Payer: BLUE CROSS/BLUE SHIELD | Source: Ambulatory Visit | Attending: Maternal Newborn | Admitting: Maternal Newborn

## 2018-05-02 VITALS — BP 100/60 | Ht 67.0 in | Wt 145.0 lb

## 2018-05-02 DIAGNOSIS — Z113 Encounter for screening for infections with a predominantly sexual mode of transmission: Secondary | ICD-10-CM

## 2018-05-02 DIAGNOSIS — Z30011 Encounter for initial prescription of contraceptive pills: Secondary | ICD-10-CM

## 2018-05-02 DIAGNOSIS — Z3009 Encounter for other general counseling and advice on contraception: Secondary | ICD-10-CM

## 2018-05-02 DIAGNOSIS — Z3202 Encounter for pregnancy test, result negative: Secondary | ICD-10-CM

## 2018-05-02 DIAGNOSIS — Z01419 Encounter for gynecological examination (general) (routine) without abnormal findings: Secondary | ICD-10-CM

## 2018-05-02 DIAGNOSIS — Z124 Encounter for screening for malignant neoplasm of cervix: Secondary | ICD-10-CM

## 2018-05-02 DIAGNOSIS — Z87891 Personal history of nicotine dependence: Secondary | ICD-10-CM | POA: Insufficient documentation

## 2018-05-02 DIAGNOSIS — Z79899 Other long term (current) drug therapy: Secondary | ICD-10-CM | POA: Insufficient documentation

## 2018-05-02 LAB — POCT URINE PREGNANCY: Preg Test, Ur: NEGATIVE

## 2018-05-02 MED ORDER — LEVONORGEST-ETH ESTRAD 91-DAY 0.15-0.03 &0.01 MG PO TABS: 1.0000 | ORAL_TABLET | Freq: Every day | ORAL | 4 refills | Status: DC

## 2018-05-02 NOTE — Progress Notes (Signed)
Gynecology Annual Exam  PCP: Reubin MilanBerglund, Laura H, MD  Chief Complaint:  Chief Complaint  Patient presents with  . Gynecologic Exam    History of Present Illness: Patient is a 22 y.o. G0P0000 presents for annual exam. The patient has no complaints today.   LMP: Patient's last menstrual period was 04/11/2018. Average Interval: regular, 28 days Duration of flow: 5-7 days Heavy Menses: no Clots: no Intermenstrual Bleeding: no Postcoital Bleeding: no Dysmenorrhea: Mild  The patient is sexually active. She currently uses none for contraception. She denies dyspareunia.  The patient occasionally performs self breast exams.  There is no notable family history of breast or ovarian cancer in her family.  The patient wears seatbelts: yes.  The patient has regular exercise: yes.    The patient denies current symptoms of depression.    Review of Systems  Constitutional: Negative.   HENT: Negative.   Respiratory: Negative for cough, shortness of breath and wheezing.   Cardiovascular: Negative for chest pain and palpitations.  Gastrointestinal: Positive for constipation and diarrhea. Negative for abdominal pain, heartburn and vomiting.  Skin: Negative.   Neurological: Negative.   Endo/Heme/Allergies: Negative.   Psychiatric/Behavioral: The patient is nervous/anxious.   All other systems reviewed and are negative.   Past Medical History:  Past Medical History:  Diagnosis Date  . Birth control   . Nervous stomach    no red meat   . Smoker     Past Surgical History:  Past Surgical History:  Procedure Laterality Date  . NO PAST SURGERIES      Gynecologic History:  Patient's last menstrual period was 04/11/2018. Contraception: none Last Pap: 12/18/2016. Results were: NILM  Obstetric History: G0P0000  Family History:  Family History  Problem Relation Age of Onset  . Cancer Father   . Diabetes Maternal Aunt   . Cancer Paternal Uncle   . Diabetes Maternal Grandmother     . Cancer Cousin   . Cancer Cousin   . Cancer Other     Social History:  Social History   Socioeconomic History  . Marital status: Single    Spouse name: Not on file  . Number of children: Not on file  . Years of education: Not on file  . Highest education level: Not on file  Occupational History  . Not on file  Social Needs  . Financial resource strain: Not on file  . Food insecurity:    Worry: Not on file    Inability: Not on file  . Transportation needs:    Medical: Not on file    Non-medical: Not on file  Tobacco Use  . Smoking status: Former Smoker    Packs/day: 0.25    Years: 5.00    Pack years: 1.25    Types: Cigarettes  . Smokeless tobacco: Never Used  Substance and Sexual Activity  . Alcohol use: No    Alcohol/week: 0.0 oz  . Drug use: No  . Sexual activity: Yes  Lifestyle  . Physical activity:    Days per week: Not on file    Minutes per session: Not on file  . Stress: Not on file  Relationships  . Social connections:    Talks on phone: Not on file    Gets together: Not on file    Attends religious service: Not on file    Active member of club or organization: Not on file    Attends meetings of clubs or organizations: Not on file    Relationship  status: Not on file  . Intimate partner violence:    Fear of current or ex partner: Not on file    Emotionally abused: Not on file    Physically abused: Not on file    Forced sexual activity: Not on file  Other Topics Concern  . Not on file  Social History Narrative   Working full time [employer]    Allergies:  No Known Allergies  Medications: Prior to Admission medications   Medication Sig Start Date End Date Taking? Authorizing Provider  ALPRAZolam (XANAX) 0.25 MG tablet Take 1 tablet (0.25 mg total) by mouth 2 (two) times daily as needed for anxiety. Patient not taking: Reported on 05/02/2018 12/24/17   Reubin Milan, MD  JOLESSA 0.15-0.03 MG tablet TAKE 1 TABLET BY MOUTH DAILY. Patient  not taking: Reported on 05/02/2018 02/26/18   Nadara Mustard, MD  polyethylene glycol [facility] / Ethelene Hal) packet Take 17 g by mouth daily.    [provider]    Physical Exam Vitals: Blood pressure 100/60, height 5\' 7"  (1.702 m), weight 145 lb (65.8 kg), last menstrual period 04/11/2018.  General: NAD HEENT: normocephalic, anicteric Thyroid: no enlargement, no palpable nodules Pulmonary: No increased work of breathing, CTAB Cardiovascular: RRR, no murmurs, rubs or gallops Breast: Breasts symmetrical, no tenderness, no palpable nodules or masses, no skin or nipple retraction present, no nipple discharge.  No axillary or supraclavicular lymphadenopathy. Abdomen: Soft, non-tender, non-distended.  Umbilicus without lesions.  No hepatomegaly, splenomegaly or masses palpable. No evidence of hernia  Genitourinary:  External: Normal external female genitalia.  Normal  urethral meatus, normal Bartholin's and Skene's glands.    Vagina: Normal vaginal mucosa, no evidence of prolapse.    Cervix: Grossly normal in appearance, no bleeding  Uterus: Non-enlarged, mobile, normal contour.  No CMT  Adnexa: ovaries non-enlarged, no adnexal masses  Rectal: deferred  Lymphatic: no evidence of inguinal lymphadenopathy Extremities: no edema, erythema, or tenderness Neurologic: Grossly intact Psychiatric: mood appropriate, affect full  Assessment: 22 y.o. G0P0000 routine annual exam, initiation of oral contraceptives.  Plan: Problem List Items Addressed This Visit    None    Visit Diagnoses    Women's annual routine gynecological examination    -  Primary   General counseling and advice on contraceptive management       Screen for STD (sexually transmitted disease)       Relevant Orders   Cytology - PAP   HEP, RPR, HIV Panel   Pap smear for cervical cancer screening       Relevant Orders   Cytology - PAP      1) STI screening was offered and accepted.  2) ASCCP guidelines and rationale  discussed.  Patient opts for yearly screening interval.  4) Contraception - Education given regarding options for contraception, including injectable contraception, IUD placement, oral contraceptives, NuvaRing, Nexplanon.   Patient is interested in OCP. She has successfully used this method before. Rx sent for generic version of Seasonique per her preference for extended cycle pills.  5) Follow up 1 year for routine annual exam.  Marcelyn Bruins, CNM 05/02/2018  4:06 PM

## 2018-05-03 LAB — HEP, RPR, HIV PANEL
HIV Screen 4th Generation wRfx: NONREACTIVE
Hepatitis B Surface Ag: NEGATIVE
RPR Ser Ql: NONREACTIVE

## 2018-05-06 LAB — CYTOLOGY - PAP
Chlamydia: NEGATIVE
Diagnosis: NEGATIVE
Neisseria Gonorrhea: NEGATIVE
Trichomonas: NEGATIVE

## 2018-05-07 LAB — CERVICOVAGINAL ANCILLARY ONLY: Herpes: NEGATIVE

## 2019-04-22 DIAGNOSIS — Z20828 Contact with and (suspected) exposure to other viral communicable diseases: Secondary | ICD-10-CM | POA: Diagnosis not present

## 2019-04-30 ENCOUNTER — Ambulatory Visit
Admission: EM | Admit: 2019-04-30 | Discharge: 2019-04-30 | Disposition: A | Discharge status: Home / Self Care | Payer: BC Managed Care – PPO | Attending: Family Medicine | Admitting: Family Medicine

## 2019-04-30 ENCOUNTER — Encounter: Payer: Self-pay | Admitting: Emergency Medicine

## 2019-04-30 ENCOUNTER — Other Ambulatory Visit: Payer: Self-pay

## 2019-04-30 DIAGNOSIS — K529 Noninfective gastroenteritis and colitis, unspecified: Secondary | ICD-10-CM

## 2019-04-30 MED ORDER — ONDANSETRON HCL 4 MG PO TABS: 4.0000 mg | ORAL_TABLET | Freq: Three times a day (TID) | ORAL | 0 refills | Status: DC | PRN

## 2019-04-30 MED ORDER — ONDANSETRON 8 MG PO TBDP
8.0000 mg | ORAL_TABLET | Freq: Once | ORAL | Status: AC
  Administered 2019-04-30: 8 mg via ORAL

## 2019-04-30 NOTE — ED Triage Notes (Signed)
Patient c/o vomiting x 2 days. She does report diarrhea x 1 day but that has resolved. She was sent home from work yesterday and was told she needed to come to urgent care to be seen. Denies fever.

## 2019-04-30 NOTE — Discharge Instructions (Signed)
Rest.   Lots of fluids.   Bland diet.  Take care  Dr. Lacinda Axon

## 2019-04-30 NOTE — ED Provider Notes (Signed)
[facility]    CSN: [id number] Arrival date & time: 04/30/19  0813  History   Chief Complaint Chief Complaint  Patient presents with  . Emesis   HPI   23 year old female presents with nausea, vomiting, diarrhea.  Patient reports a 2-day history of nausea, vomiting, diarrhea.  Diarrhea has now resolved.  However, she continues to have nausea and associated vomiting.  Last episode of emesis was last night.  She has not tried to eat or drink anything today.  She was excused from work yesterday and was told that she needed to be evaluated.  No fever.  No sick contacts.  No abdominal pain.  No known inciting factor.  No known exacerbating/relieving factors.  No other complaints.  PMH, Surgical Hx, Family Hx, Social History reviewed and updated as below.  Past Medical History:  Diagnosis Date  . Birth control   . Nervous stomach    no red meat   . Smoker    Patient Active Problem List   Diagnosis Date Noted  . Anxiety 12/24/2017  . Menstrual irregularity 11/02/2016  . Irritable bowel syndrome (IBS) 05/18/2016  . Smoker 05/18/2016   Past Surgical History:  Procedure Laterality Date  . NO PAST SURGERIES     OB History    Gravida  0   Para  0   Term  0   Preterm  0   AB  0   Living  0     SAB  0   TAB  0   Ectopic  0   Multiple  0   Live Births  0          Home Medications    Prior to Admission medications   Medication Sig Start Date End Date Taking? Authorizing Provider  Levonorgestrel-Ethinyl Estradiol (AMETHIA,CAMRESE) 0.15-0.03 &0.01 MG tablet Take 1 tablet by mouth daily. 05/02/18  Yes Oswaldo ConroySchmid, Jacelyn Y, CNM  ondansetron (ZOFRAN) 4 MG tablet Take 1-2 tablets (4-8 mg total) by mouth every 8 (eight) hours as needed for nausea, vomiting or refractory nausea / vomiting. 04/30/19   Tommie Samsook, Bren Steers G, DO    Family History Family History  Problem Relation Age of Onset  . Cancer Father   . Diabetes Maternal Aunt   . Cancer Paternal Uncle   .  Diabetes Maternal Grandmother   . Cancer Cousin   . Cancer Cousin   . Cancer Other     Social History Social History   Tobacco Use  . Smoking status: Former Smoker    Packs/day: 0.25    Years: 5.00    Pack years: 1.25    Types: Cigarettes  . Smokeless tobacco: Never Used  Substance Use Topics  . Alcohol use: No    Alcohol/week: 0.0 standard drinks  . Drug use: No     Allergies   Patient has no known allergies.   Review of Systems Review of Systems  Constitutional: Positive for appetite change. Negative for fever.  Gastrointestinal: Positive for diarrhea, nausea and vomiting.   Physical Exam Triage Vital Signs ED Triage Vitals  Enc Vitals Group     BP 04/30/19 0827 109/68     Pulse Rate 04/30/19 0827 66     Resp 04/30/19 0827 18     Temp 04/30/19 0827 99.3 F (37.4 C)     Temp Source 04/30/19 0827 Oral     SpO2 04/30/19 0827 100 %     Weight 04/30/19 0825 130 lb (59 kg)     Height  04/30/19 0825 5\' 7"  (1.702 m)     Head Circumference --      Peak Flow --      Pain Score 04/30/19 0825 0     Pain Loc --      Pain Edu? --      Excl. in [place]? --    Updated Vital Signs BP 109/68 (BP Location: Right Arm)   Pulse 66   Temp 99.3 F (37.4 C) (Oral)   Resp 18   Ht 5\' 7"  (1.702 m)   Wt 59 kg   SpO2 100%   BMI 20.36 kg/m   Visual Acuity Right Eye Distance:   Left Eye Distance:   Bilateral Distance:    Right Eye Near:   Left Eye Near:    Bilateral Near:     Physical Exam Vitals signs and nursing note reviewed.  Constitutional:      General: She is not in acute distress.    Appearance: Normal appearance. She is normal weight.  HENT:     Head: Normocephalic and atraumatic.     Mouth/Throat:     Mouth: Mucous membranes are moist.     Pharynx: Oropharynx is clear. No posterior oropharyngeal erythema.  Eyes:     General:        Right eye: No discharge.        Left eye: No discharge.     Conjunctiva/sclera: Conjunctivae normal.  Cardiovascular:      Rate and Rhythm: Normal rate and regular rhythm.  Pulmonary:     Effort: Pulmonary effort is normal.     Breath sounds: Normal breath sounds. No wheezing, rhonchi or rales.  Abdominal:     General: There is no distension.     Palpations: Abdomen is soft.     Tenderness: There is no abdominal tenderness.  Neurological:     Mental Status: She is alert.  Psychiatric:        Mood and Affect: Mood normal.        Behavior: Behavior normal.    UC Treatments / Results  Labs (all labs ordered are listed, but only abnormal results are displayed) Labs Reviewed - No data to display  EKG   Radiology No results found.  Procedures Procedures (including critical care time)  Medications Ordered in UC Medications  ondansetron (ZOFRAN-ODT) disintegrating tablet 8 mg (8 mg Oral Given 04/30/19 3664)    Initial Impression / Assessment and Plan / UC Course  I have reviewed the triage vital signs and the nursing notes.  Pertinent labs & imaging results that were available during my care of the patient were reviewed by me and considered in my medical decision making (see chart for details).    23 year old female presents with viral gastroenteritis.  Zofran given today.  Tolerating fluids at the time of discharge.  Sending home on Zofran.  Supportive care.  Work note given.  Final Clinical Impressions(s) / UC Diagnoses   Final diagnoses:  Gastroenteritis     Discharge Instructions     Rest.   Lots of fluids.   Bland diet.  Take care  Dr. Lacinda Axon     ED Prescriptions    Medication Sig Dispense Auth. Provider   ondansetron (ZOFRAN) 4 MG tablet Take 1-2 tablets (4-8 mg total) by mouth every 8 (eight) hours as needed for nausea, vomiting or refractory nausea / vomiting. 20 tablet Coral Spikes, DO     Controlled Substance Prescriptions [place] Controlled Substance Registry consulted? Not Applicable  Everlene OtherCook, Ivery Nanney FontanaG, OhioDO 04/30/19 [phone]

## 2019-05-17 ENCOUNTER — Other Ambulatory Visit: Payer: Self-pay | Admitting: Maternal Newborn

## 2019-05-17 DIAGNOSIS — Z30011 Encounter for initial prescription of contraceptive pills: Secondary | ICD-10-CM

## 2019-05-18 NOTE — Telephone Encounter (Signed)
Needs an appointment for additional refills.  

## 2019-05-21 ENCOUNTER — Ambulatory Visit: Payer: BC Managed Care – PPO | Admitting: Internal Medicine

## 2019-05-22 ENCOUNTER — Other Ambulatory Visit (HOSPITAL_COMMUNITY)
Admission: RE | Admit: 2019-05-22 | Discharge: 2019-05-22 | Disposition: A | Discharge status: Home / Self Care | Payer: BC Managed Care – PPO | Source: Ambulatory Visit | Attending: Obstetrics and Gynecology | Admitting: Obstetrics and Gynecology

## 2019-05-22 ENCOUNTER — Encounter: Payer: Self-pay | Admitting: Obstetrics and Gynecology

## 2019-05-22 ENCOUNTER — Other Ambulatory Visit: Payer: Self-pay

## 2019-05-22 ENCOUNTER — Ambulatory Visit (INDEPENDENT_AMBULATORY_CARE_PROVIDER_SITE_OTHER): Payer: BC Managed Care – PPO | Admitting: Obstetrics and Gynecology

## 2019-05-22 VITALS — BP 102/66 | Ht 67.0 in | Wt 132.0 lb

## 2019-05-22 DIAGNOSIS — Z113 Encounter for screening for infections with a predominantly sexual mode of transmission: Secondary | ICD-10-CM

## 2019-05-22 DIAGNOSIS — N939 Abnormal uterine and vaginal bleeding, unspecified: Secondary | ICD-10-CM | POA: Diagnosis not present

## 2019-05-22 DIAGNOSIS — N946 Dysmenorrhea, unspecified: Secondary | ICD-10-CM | POA: Diagnosis not present

## 2019-05-22 MED ORDER — NORGESTIMATE-ETH ESTRADIOL 0.25-35 MG-MCG PO TABS: 1.0000 | ORAL_TABLET | Freq: Every day | ORAL | 11 refills | Status: DC

## 2019-05-22 NOTE — Progress Notes (Signed)
Gynecology Abnormal Uterine Bleeding Initial Evaluation   Chief Complaint:  Chief Complaint  Patient presents with  . Vaginal Bleeding    Continuous bleeding x 6 weeks on OCP    History of Present Illness:    Paitient is a 23 y.o. G0P0000 who LMP was Patient's last menstrual period was 04/10/2019., presents today for a problem visit.  She complains of menorrhagia that  began 6 weeks and its severity is described as moderate.  The patient menstrual complaints are acute.  She has previously noted regular monthly menses and they are were not associated with menstrual cramping. She has had menstrual cramping with this current episode of bleeding.  No missed pills.  No currently sexually active so no dyspareunia noted.  She currently uses OCP (estrogen/progesterone)for contraception.  Last Pap results esults were obtained 05/02/2018 no abnormalities   Spotting past 6 weeks on OCP.  Also associated dysmenorrhea yes  Previous evaluation:none Previous Treatment: none  LMP: Patient's last menstrual period was 04/10/2019.  Paramter Normal / Abnormal Prsent  Frequency [place]hea     Infrequent (>38 days)     Normal (?24 days ?38 days)     Freequent (<24 days) X  Duration Normal (?8 days)     Prolonged (>8 days) X  Regularity Regular (shortest to longest cycle variation ?7-9 days)* X   Irregular (shortest to longest cycle variation ?8-10days)*    Flow Volume Light    (Self reported) Normal X   Heavy        Intermenstrual Bleeding None X   Random     Cyclical early     Cyclical mid     Cyclical late        Unscheduled Bleeding  Not applicable    (exogenous hormones) Absent     Present X   FIGO AUB I System: *The available evidence suggests that, using these criteria, the normal range (shortest to longest) varies with age: 1718-25 y of age, ?859 d; 6826-41 y, ?7 d; and for 6842-45 y, ?9 d    Review of Systems: ROS  Past Medical History:  Past Medical History:  Diagnosis Date  .  Birth control   . Nervous stomach    no red meat   . Smoker     Past Surgical History:  Past Surgical History:  Procedure Laterality Date  . NO PAST SURGERIES      Obstetric History: G0P0000  Family History:  Family History  Problem Relation Age of Onset  . Cancer Father   . Diabetes Maternal Aunt   . Cancer Paternal Uncle   . Diabetes Maternal Grandmother   . Cancer Cousin   . Cancer Cousin   . Cancer Other     Social History:  Social History   Socioeconomic History  . Marital status: Single    Spouse name: Not on file  . Number of children: Not on file  . Years of education: Not on file  . Highest education level: Not on file  Occupational History  . Not on file  Social Needs  . Financial resource strain: Not on file  . Food insecurity    Worry: Not on file    Inability: Not on file  . Transportation needs    Medical: Not on file    Non-medical: Not on file  Tobacco Use  . Smoking status: Former Smoker    Packs/day: 0.25    Years: 5.00    Pack years: 1.25    Types:  Cigarettes  . Smokeless tobacco: Never Used  Substance and Sexual Activity  . Alcohol use: No    Alcohol/week: 0.0 standard drinks  . Drug use: No  . Sexual activity: Yes    Birth control/protection: Pill  Lifestyle  . Physical activity    Days per week: Not on file    Minutes per session: Not on file  . Stress: Not on file  Relationships  . Social Musicianconnections    Talks on phone: Not on file    Gets together: Not on file    Attends religious service: Not on file    Active member of club or organization: Not on file    Attends meetings of clubs or organizations: Not on file    Relationship status: Not on file  . Intimate partner violence    Fear of current or ex partner: Not on file    Emotionally abused: Not on file    Physically abused: Not on file    Forced sexual activity: Not on file  Other Topics Concern  . Not on file  Social History Narrative   Working full time [employer]    Allergies:  No Known Allergies  Medications: Prior to Admission medications   Medication Sig Start Date End Date Taking? Authorizing Provider  Levonorgestrel-Ethinyl Estradiol (AMETHIA) 0.15-0.03 &0.01 MG tablet TAKE 1 TABLET BY MOUTH EVERY DAY 05/18/19  Yes Oswaldo ConroySchmid, Jacelyn Y, CNM  ondansetron (ZOFRAN) 4 MG tablet Take 1-2 tablets (4-8 mg total) by mouth every 8 (eight) hours as needed for nausea, vomiting or refractory nausea / vomiting. Patient not taking: Reported on 05/22/2019 04/30/19   Tommie Samsook, Jayce G, DO    Physical Exam Blood pressure 102/66, height 5\' 7"  (1.702 m), weight 132 lb (59.9 kg), last menstrual period 04/10/2019.  Patient's last menstrual period was 04/10/2019.  General: NAD, well nourished, appears stated age HEENT: normocephalic, anicteric Pulmonary: No increased work of breathing Abdomen: NABS, soft, non-tender, non-distended.  Umbilicus without lesions.  No hepatomegaly, splenomegaly or masses palpable. No evidence of hernia  Genitourinary:  External: Normal external female genitalia.  Normal urethral meatus, normal Bartholin's and Skene's glands.    Vagina: Normal vaginal mucosa, no evidence of prolapse.    Cervix: Grossly normal in appearance, no bleeding noted, cervical tissue non-friable  Uterus: Non-enlarged, mobile, normal contour.  No CMT  Adnexa: ovaries non-enlarged, no adnexal masses  Rectal: deferred  Lymphatic: no evidence of inguinal lymphadenopathy Extremities: no edema, erythema, or tenderness Neurologic: Grossly intact Psychiatric: mood appropriate, affect full  Female chaperone present for pelvic portions of the physical exam  Assessment: 23 y.o. G0P0000 with abnormal uterine bleeding  Plan: Problem List Items Addressed This Visit    None    Visit Diagnoses    Dysmenorrhea    -  Primary   Abnormal uterine bleeding       Relevant Orders   Cervicovaginal ancillary only   Routine screening for STI (sexually transmitted  infection)       Relevant Orders   Cervicovaginal ancillary only      1) Discussed management options for abnormal uterine bleeding including expectant, NSAIDs, tranexamic acid (Lysteda), oral progesterone (Provera, norethindrone, megace), Depo Provera, Levonorgestrel containing IUD, endometrial ablation (Novasure) or hysterectomy as definitive surgical management.  Discussed risks and benefits of each method.    - GC/CT cultures obtained given age and associated dysmenorrhea - Stop OCP for 5 days, switch to sprintec to see if bleeding resolves  2) Return in about 6  weeks (around 07/03/2019) for medication follow up phone.   Malachy Mood, MD, [facility], [facility] 05/22/2019, 3:16 PM

## 2019-05-22 NOTE — Patient Instructions (Signed)
Stop current OCP for 5 days, then start new pill pack

## 2019-05-26 ENCOUNTER — Other Ambulatory Visit: Payer: Self-pay | Admitting: Obstetrics and Gynecology

## 2019-05-26 LAB — CERVICOVAGINAL ANCILLARY ONLY
Chlamydia: NEGATIVE
Neisseria Gonorrhea: NEGATIVE
Trichomonas: POSITIVE — AB

## 2019-05-26 MED ORDER — METRONIDAZOLE 500 MG PO TABS: 2000.0000 mg | ORAL_TABLET | Freq: Once | ORAL | 0 refills | Status: AC

## 2019-05-27 ENCOUNTER — Telehealth: Payer: Self-pay | Admitting: Obstetrics and Gynecology

## 2019-05-27 ENCOUNTER — Other Ambulatory Visit: Payer: Self-pay | Admitting: Obstetrics and Gynecology

## 2019-05-27 MED ORDER — METRONIDAZOLE 500 MG PO TABS: 2000.0000 mg | ORAL_TABLET | Freq: Once | ORAL | 0 refills | Status: AC

## 2019-05-27 NOTE — Telephone Encounter (Signed)
Patient is calling for labs results. Please advise. 

## 2019-07-03 ENCOUNTER — Ambulatory Visit: Payer: BC Managed Care – PPO | Admitting: Obstetrics and Gynecology

## 2019-07-10 ENCOUNTER — Other Ambulatory Visit: Payer: Self-pay

## 2019-07-10 ENCOUNTER — Ambulatory Visit (INDEPENDENT_AMBULATORY_CARE_PROVIDER_SITE_OTHER): Payer: BC Managed Care – PPO | Admitting: Obstetrics and Gynecology

## 2019-07-10 ENCOUNTER — Encounter: Payer: Self-pay | Admitting: Obstetrics and Gynecology

## 2019-07-10 DIAGNOSIS — N939 Abnormal uterine and vaginal bleeding, unspecified: Secondary | ICD-10-CM | POA: Diagnosis not present

## 2019-07-10 NOTE — Progress Notes (Signed)
I connected with Carla Drape Innes on 07/10/19 at  1:50 PM EDT by telephone and verified that I am speaking with the correct person using two identifiers.   I discussed the limitations, risks, security and privacy concerns of performing an evaluation and management service by telephone and the availability of in person appointments. I also discussed with the patient that there may be a patient responsible charge related to this service. The patient expressed understanding and agreed to proceed.  The patient was at home I spoke with the patient from my workstation phone The names of people involved in this encounter were: Eilene Ghazi , and Vena Austria   Obstetrics & Gynecology Office Visit   Chief Complaint:  Chief Complaint  Patient presents with  . Follow-up    Medication follow up, Birthcontrol    History of Present Illness: 23 y.o. G0P0000 presenting for medication follow up for a diagnosis of AUB.  She is currently being managed with OCP.   The patient reports improvement in symptoms but continued breakthrough bleeding unscheduled.  On her current medication regimen has intermittent breakthrough bleeding.   She has not noted any side-effects or new symptoms.    Review of Systems: Review of Systems  Constitutional: Negative.   Gastrointestinal: Negative.   Neurological: Negative for headaches.     Past Medical History:  Past Medical History:  Diagnosis Date  . Birth control   . Nervous stomach    no red meat   . Smoker     Past Surgical History:  Past Surgical History:  Procedure Laterality Date  . NO PAST SURGERIES      Gynecologic History: Patient's last menstrual period was 06/26/2019 (exact date).  Obstetric History: G0P0000  Family History:  Family History  Problem Relation Age of Onset  . Cancer Father   . Diabetes Maternal Aunt   . Cancer Paternal Uncle   . Diabetes Maternal Grandmother   . Cancer Cousin   . Cancer Cousin   . Cancer Other      Social History:  Social History   Socioeconomic History  . Marital status: Single    Spouse name: Not on file  . Number of children: Not on file  . Years of education: Not on file  . Highest education level: Not on file  Occupational History  . Not on file  Social Needs  . Financial resource strain: Not on file  . Food insecurity    Worry: Not on file    Inability: Not on file  . Transportation needs    Medical: Not on file    Non-medical: Not on file  Tobacco Use  . Smoking status: Former Smoker    Packs/day: 0.25    Years: 5.00    Pack years: 1.25    Types: Cigarettes  . Smokeless tobacco: Never Used  Substance and Sexual Activity  . Alcohol use: No    Alcohol/week: 0.0 standard drinks  . Drug use: No  . Sexual activity: Yes    Birth control/protection: Pill  Lifestyle  . Physical activity    Days per week: Not on file    Minutes per session: Not on file  . Stress: Not on file  Relationships  . Social Musician on phone: Not on file    Gets together: Not on file    Attends religious service: Not on file    Active member of club or organization: Not on file    Attends meetings  of clubs or organizations: Not on file    Relationship status: Not on file  . Intimate partner violence    Fear of current or ex partner: Not on file    Emotionally abused: Not on file    Physically abused: Not on file    Forced sexual activity: Not on file  Other Topics Concern  . Not on file  Social History Narrative   Working full time [employer]    Allergies:  No Known Allergies  Medications: Prior to Admission medications   Medication Sig Start Date End Date Taking? Authorizing Provider  Levonorgestrel-Ethinyl Estradiol (AMETHIA) 0.15-0.03 &0.01 MG tablet TAKE 1 TABLET BY MOUTH EVERY DAY 05/18/19  Yes Rexene Agent, CNM    Physical Exam Vitals: There were no vitals filed for this visit. Patient's last menstrual period was 06/26/2019 (exact date).   No physical exam as this was a remote telephone visit to promote social distancing during the current COVID-19 Pandemic  Assessment: 23 y.o. G0P0000 with AUB on OCP  Plan: Problem List Items Addressed This Visit    None    Visit Diagnoses    Abnormal uterine bleeding    -  Primary   Relevant Orders   US Transvaginal Non-OB     1) AUB - did not resolve after switch in OCP dosing.  Was treated for trichomonas as last visit so though was potential cervicitis as well.  Given continued breakthrough bleeding will check TVUS to evaluate for structural lesions.  2) Telephone time 6:46min  3) Return in about 2 weeks (around 07/24/2019) for GYN and TVUS follow up.   Malachy Mood, MD, [facility], [facility] 07/10/2019, 4:39 PM

## 2019-07-28 ENCOUNTER — Encounter: Payer: Self-pay | Admitting: Obstetrics and Gynecology

## 2019-07-28 ENCOUNTER — Other Ambulatory Visit: Payer: Self-pay

## 2019-07-28 ENCOUNTER — Ambulatory Visit (INDEPENDENT_AMBULATORY_CARE_PROVIDER_SITE_OTHER): Payer: BC Managed Care – PPO

## 2019-07-28 ENCOUNTER — Ambulatory Visit (INDEPENDENT_AMBULATORY_CARE_PROVIDER_SITE_OTHER): Payer: BC Managed Care – PPO | Admitting: Obstetrics and Gynecology

## 2019-07-28 VITALS — BP 112/68 | HR 56 | Ht 68.0 in | Wt 129.0 lb

## 2019-07-28 DIAGNOSIS — N939 Abnormal uterine and vaginal bleeding, unspecified: Secondary | ICD-10-CM

## 2019-07-28 MED ORDER — LO LOESTRIN FE 1 MG-10 MCG / 10 MCG PO TABS: 1.0000 | ORAL_TABLET | Freq: Every day | ORAL | 3 refills | Status: DC

## 2019-07-28 NOTE — Progress Notes (Signed)
Gynecology Ultrasound Follow Up  Chief Complaint:  Chief Complaint  Patient presents with  . Follow-up    GYN ultrasound     History of Present Illness: Patient is a 23 y.o. female who presents today for ultrasound evaluation of AUB .  Ultrasound demonstrates the following findgins Adnexa: normal  Uterus: Non-enlarged with endometrial stripe non-thickened, non focal abnormalities Additional: no free fluid  Still having some irregular spotting on current OCP even after treatment for trichomonas   Review of Systems: Review of Systems  Constitutional: Negative.   Gastrointestinal: Negative.   Genitourinary: Negative.     Past Medical History:  Past Medical History:  Diagnosis Date  . Birth control   . Nervous stomach    no red meat   . Smoker     Past Surgical History:  Past Surgical History:  Procedure Laterality Date  . NO PAST SURGERIES      Gynecologic History:  No LMP recorded. (Menstrual status: Oral contraceptives). Contraception: OCP (estrogen/progesterone) Last Pap: 05/02/2018. Results were: .no abnormalities  Family History:  Family History  Problem Relation Age of Onset  . Cancer Father   . Diabetes Maternal Aunt   . Cancer Paternal Uncle   . Diabetes Maternal Grandmother   . Cancer Cousin   . Cancer Cousin   . Cancer Other     Social History:  Social History   Socioeconomic History  . Marital status: Single    Spouse name: Not on file  . Number of children: Not on file  . Years of education: Not on file  . Highest education level: Not on file  Occupational History  . Not on file  Social Needs  . Financial resource strain: Not on file  . Food insecurity    Worry: Not on file    Inability: Not on file  . Transportation needs    Medical: Not on file    Non-medical: Not on file  Tobacco Use  . Smoking status: Former Smoker    Packs/day: 0.25    Years: 5.00    Pack years: 1.25    Types: Cigarettes  . Smokeless tobacco: Never  Used  Substance and Sexual Activity  . Alcohol use: No    Alcohol/week: 0.0 standard drinks  . Drug use: No  . Sexual activity: Yes    Birth control/protection: Pill  Lifestyle  . Physical activity    Days per week: Not on file    Minutes per session: Not on file  . Stress: Not on file  Relationships  . Social Musician on phone: Not on file    Gets together: Not on file    Attends religious service: Not on file    Active member of club or organization: Not on file    Attends meetings of clubs or organizations: Not on file    Relationship status: Not on file  . Intimate partner violence    Fear of current or ex partner: Not on file    Emotionally abused: Not on file    Physically abused: Not on file    Forced sexual activity: Not on file  Other Topics Concern  . Not on file  Social History Narrative   Working full time [employer]    Allergies:  No Known Allergies  Medications: Prior to Admission medications   Medication Sig Start Date End Date Taking? Authorizing Provider  Norethindrone-Ethinyl Estradiol-Fe Biphas (LO LOESTRIN FE) 1 MG-10 MCG / 10 MCG tablet  Take 1 tablet by mouth daily. 07/28/19 10/20/19  Malachy Mood, MD    Physical Exam Vitals: Blood pressure 112/68, pulse (!) 56, height 5\' 8"  (1.727 m), weight 129 lb (58.5 kg).  General: NAD HEENT: normocephalic, anicteric Pulmonary: No increased work of breathing Extremities: no edema, erythema, or tenderness Neurologic: Grossly intact, normal gait Psychiatric: mood appropriate, affect full  Wet Prep: PH: <4.5 Clue Cells: Negative Fungal elements: Negative Trichomonas: Negative  Assessment: 23 y.o. G0P0000 follow up AUB  Plan: Problem List Items Addressed This Visit    None    Visit Diagnoses    Abnormal uterine bleeding    -  Primary   Relevant Orders   TSH+Prl+FSH+TestT+LH+DHEA S...      1) AUB - no identifiable structural lesions.  Trichomonas cervicitis treated at last  visit, negative wet mount today.  Will obtain PCOS panel.  Switch to Lo Loestrin FE  2) A total of 15 minutes were spent in face-to-face contact with the patient during this encounter with over half of that time devoted to counseling and coordination of care.  3) No follow-ups on file.    Malachy Mood, MD, [facility], [place]

## 2019-08-03 LAB — TSH+PRL+FSH+TESTT+LH+DHEA S...
17-Hydroxyprogesterone: 12 ng/dL
Androstenedione: 67 ng/dL (ref 41–262)
DHEA-SO4: 181 ug/dL (ref 110.0–431.7)
FSH: 7.4 m[IU]/mL
LH: 6.1 m[IU]/mL
Prolactin: 10.8 ng/mL (ref 4.8–23.3)
TSH: 0.556 u[IU]/mL (ref 0.450–4.500)
Testosterone, Free: 0.6 pg/mL (ref 0.0–4.2)
Testosterone: 8 ng/dL (ref 8–48)

## 2019-09-14 ENCOUNTER — Ambulatory Visit (INDEPENDENT_AMBULATORY_CARE_PROVIDER_SITE_OTHER): Payer: BC Managed Care – PPO | Admitting: Obstetrics and Gynecology

## 2019-09-14 ENCOUNTER — Encounter: Payer: Self-pay | Admitting: Obstetrics and Gynecology

## 2019-09-14 ENCOUNTER — Other Ambulatory Visit: Payer: Self-pay

## 2019-09-14 ENCOUNTER — Other Ambulatory Visit (HOSPITAL_COMMUNITY)
Admission: RE | Admit: 2019-09-14 | Discharge: 2019-09-14 | Disposition: A | Discharge status: Home / Self Care | Payer: BC Managed Care – PPO | Source: Ambulatory Visit | Attending: Obstetrics and Gynecology | Admitting: Obstetrics and Gynecology

## 2019-09-14 VITALS — BP 100/70 | Ht 64.0 in | Wt 132.0 lb

## 2019-09-14 DIAGNOSIS — A599 Trichomoniasis, unspecified: Secondary | ICD-10-CM | POA: Diagnosis not present

## 2019-09-14 DIAGNOSIS — Z3041 Encounter for surveillance of contraceptive pills: Secondary | ICD-10-CM | POA: Diagnosis not present

## 2019-09-14 DIAGNOSIS — N898 Other specified noninflammatory disorders of vagina: Secondary | ICD-10-CM

## 2019-09-14 DIAGNOSIS — Z113 Encounter for screening for infections with a predominantly sexual mode of transmission: Secondary | ICD-10-CM

## 2019-09-14 NOTE — Progress Notes (Signed)
Reubin MilanBerglund, Laura H, MD   Chief Complaint  Patient presents with  . Vaginal Discharge    vaginal sensitivity up to two days ago, no odor or itchiness  NO SX NOW  . Contraception    stopped OCP's beginning last week, needs new BC    HPI:      [name] is a 23 y.o. G0P0000 who LMP was No LMP recorded., presents today for vaginal d/c with "sensitivity" for a couple wks. Sx started when starting Lo Loestrin so stopped OCPs after a few wks and vag sx resolved. No sx now. No odor, irritation. Hx of Trich 8/20, treated with flagyl. Never had TOC. Partner also did tx, sex active since tx, although not sex active recently.  No urin sx, LBP, pelvic pain, fevers.  Changed to Lo Loestrin 10/20 by Dr. Bonney AidStaebler due to AUB with other OCPs, had neg eval. Pt having bleeding again since stopping pills 3rd wk of pills but explained to pt that this is expected. She was doing well on these pills otherwise.   Patient Active Problem List   Diagnosis Date Noted  . Anxiety 12/24/2017  . Menstrual irregularity 11/02/2016  . Irritable bowel syndrome (IBS) 05/18/2016  . Smoker 05/18/2016    Past Surgical History:  Procedure Laterality Date  . NO PAST SURGERIES      Family History  Problem Relation Age of Onset  . Cancer Father   . Diabetes Maternal Aunt   . Cancer Paternal Uncle   . Diabetes Maternal Grandmother   . Cancer Cousin   . Cancer Cousin   . Cancer Other     Social History   Socioeconomic History  . Marital status: Single    Spouse name: Not on file  . Number of children: Not on file  . Years of education: Not on file  . Highest education level: Not on file  Occupational History  . Not on file  Tobacco Use  . Smoking status: Former Smoker    Packs/day: 0.25    Years: 5.00    Pack years: 1.25    Types: Cigarettes  . Smokeless tobacco: Never Used  Substance and Sexual Activity  . Alcohol use: No    Alcohol/week: 0.0 standard drinks  . Drug use: No  . Sexual  activity: Yes    Birth control/protection: None  Other Topics Concern  . Not on file  Social History Narrative   Working full time [employer]   Social Determinants of Health   Financial Resource Strain:   . Difficulty of Paying Living Expenses: Not on file  Food Insecurity:   . Worried About Programme researcher, broadcasting/film/videounning Out of Food in the Last Year: Not on file  . Ran Out of Food in the Last Year: Not on file  Transportation Needs:   . Lack of Transportation (Medical): Not on file  . Lack of Transportation (Non-Medical): Not on file  Physical Activity:   . Days of Exercise per Week: Not on file  . Minutes of Exercise per Session: Not on file  Stress:   . Feeling of Stress : Not on file  Social Connections:   . Frequency of Communication with Friends and Family: Not on file  . Frequency of Social Gatherings with Friends and Family: Not on file  . Attends Religious Services: Not on file  . Active Member of Clubs or Organizations: Not on file  . Attends BankerClub or Organization Meetings: Not on file  . Marital Status:  Not on file  Intimate Partner Violence:   . Fear of Current or Ex-Partner: Not on file  . Emotionally Abused: Not on file  . Physically Abused: Not on file  . Sexually Abused: Not on file    Outpatient Medications Prior to Visit  Medication Sig Dispense Refill  . Norethindrone-Ethinyl Estradiol-Fe Biphas (LO LOESTRIN FE) 1 MG-10 MCG / 10 MCG tablet Take 1 tablet by mouth daily. (Patient not taking: Reported on 09/14/2019) 84 tablet 3   No facility-administered medications prior to visit.      ROS:  Review of Systems  Constitutional: Positive for fatigue. Negative for fever.  Gastrointestinal: Negative for blood in stool, constipation, diarrhea, nausea and vomiting.  Genitourinary: Positive for menstrual problem and vaginal discharge. Negative for dyspareunia, dysuria, flank pain, frequency, hematuria, urgency, vaginal bleeding and vaginal pain.  Musculoskeletal: Negative  for back pain.  Skin: Negative for rash.  BREAST: No symptoms   OBJECTIVE:   Vitals:  BP 100/70   Ht 5\' 4"  (1.626 m)   Wt 132 lb (59.9 kg)   BMI 22.66 kg/m   Physical Exam Vitals reviewed.  Constitutional:      Appearance: She is well-developed.  Pulmonary:     Effort: Pulmonary effort is normal.  Genitourinary:    General: Normal vulva.     Pubic Area: No rash.      Labia:        Right: No rash, tenderness or lesion.        Left: No rash, tenderness or lesion.      Vagina: Normal. No vaginal discharge, erythema or tenderness.     Cervix: Normal.     Uterus: Normal. Not enlarged and not tender.      Adnexa: Right adnexa normal and left adnexa normal.       Right: No mass or tenderness.         Left: No mass or tenderness.    Musculoskeletal:        General: Normal range of motion.     Cervical back: Normal range of motion.  Skin:    General: Skin is warm and dry.  Neurological:     General: No focal deficit present.     Mental Status: She is alert and oriented to person, place, and time.  Psychiatric:        Mood and Affect: Mood normal.        Behavior: Behavior normal.        Thought Content: Thought content normal.        Judgment: Judgment normal.     Results: Results for orders placed or performed in visit on 09/14/19 (from the past 24 hour(s))  POCT Wet Prep with KOH     Status: Normal   Collection Time: 09/15/19  8:17 AM  Result Value Ref Range   Trichomonas, UA Negative    Clue Cells Wet Prep HPF POC neg    Epithelial Wet Prep HPF POC     Yeast Wet Prep HPF POC neg    Bacteria Wet Prep HPF POC     RBC Wet Prep HPF POC     WBC Wet Prep HPF POC     KOH Prep POC Negative Negative     Assessment/Plan: Vaginal discharge - Plan: POCT Wet Prep with KOH, neg wet prep, sx resolved. Sx not c/w OCP use, but will try OCPs again. If sx recur, then due to Lo Loestrin. F/u prn.  Trichomoniasis - Plan: [place] STD; TOC today  Screening for STD (sexually  transmitted disease) - Plan: [facility] STD  Encounter for surveillance of contraceptive pills--pt to restart OCPs since on them less than a month. Condoms. F/u prn AUB.      Return if symptoms worsen or fail to improve.  Deyci Gesell B. Kendon Sedeno, PA-C 09/15/2019 8:19 AM

## 2019-09-15 ENCOUNTER — Encounter: Payer: Self-pay | Admitting: Obstetrics and Gynecology

## 2019-09-15 LAB — POCT WET PREP WITH KOH
Clue Cells Wet Prep HPF POC: NEGATIVE
KOH Prep POC: NEGATIVE
Trichomonas, UA: NEGATIVE
Yeast Wet Prep HPF POC: NEGATIVE

## 2019-09-15 NOTE — Patient Instructions (Signed)
I value your feedback and entrusting us with your care. If you get a [facility] patient survey, I would appreciate you taking the time to let us know about your experience today. Thank you!  As of September 10, 2019, your lab results will be released to your MyChart immediately, before I even have a chance to see them. Please give me time to review them and contact you if there are any abnormalities. Thank you for your patience.  

## 2019-09-16 LAB — CERVICOVAGINAL ANCILLARY ONLY
Chlamydia: NEGATIVE
Comment: NEGATIVE
Comment: NEGATIVE
Comment: NORMAL
Neisseria Gonorrhea: NEGATIVE
Trichomonas: NEGATIVE

## 2019-11-24 ENCOUNTER — Other Ambulatory Visit: Payer: Self-pay | Admitting: Obstetrics and Gynecology

## 2019-11-24 MED ORDER — LO LOESTRIN FE 1 MG-10 MCG / 10 MCG PO TABS: 1.0000 | ORAL_TABLET | Freq: Every day | ORAL | 3 refills | Status: DC

## 2019-11-25 ENCOUNTER — Other Ambulatory Visit: Payer: Self-pay | Admitting: Obstetrics and Gynecology

## 2019-11-25 MED ORDER — NORGESTIMATE-ETH ESTRADIOL 0.25-35 MG-MCG PO TABS: 1.0000 | ORAL_TABLET | Freq: Every day | ORAL | 11 refills | Status: DC

## 2019-12-09 ENCOUNTER — Other Ambulatory Visit: Payer: Self-pay | Admitting: Obstetrics and Gynecology

## 2019-12-09 MED ORDER — DROSPIRENONE-ETHINYL ESTRADIOL 3-0.02 MG PO TABS: 1.0000 | ORAL_TABLET | Freq: Every day | ORAL | 11 refills | Status: DC

## 2019-12-28 ENCOUNTER — Other Ambulatory Visit: Payer: Self-pay

## 2019-12-28 ENCOUNTER — Ambulatory Visit: Payer: BC Managed Care – PPO | Attending: Internal Medicine

## 2019-12-28 DIAGNOSIS — Z23 Encounter for immunization: Secondary | ICD-10-CM

## 2019-12-28 NOTE — Progress Notes (Signed)
   Covid-19 Vaccination Clinic  Name:  Tammy Pena    MRN: [id number] DOB: 24-Jan-1996  12/28/2019  [name] was observed post Covid-19 immunization for 15 minutes without incident. She was provided with Vaccine Information Sheet and instruction to access the V-Safe system.   [name] was instructed to call 911 with any severe reactions post vaccine: Marland Kitchen Difficulty breathing  . Swelling of face and throat  . A fast heartbeat  . A bad rash all over body  . Dizziness and weakness   Immunizations Administered    Name Date Dose VIS Date Route   Pfizer COVID-19 Vaccine 12/28/2019 11:54 AM 0.3 mL 09/11/2019 Intramuscular   Manufacturer: [employer], [employer]   Lot: [id number]   NDC: [address]-9

## 2020-01-20 ENCOUNTER — Ambulatory Visit: Payer: BC Managed Care – PPO | Attending: Internal Medicine

## 2020-01-20 DIAGNOSIS — Z23 Encounter for immunization: Secondary | ICD-10-CM

## 2020-01-20 NOTE — Progress Notes (Signed)
   Covid-19 Vaccination Clinic  Name:  Tammy Pena    MRN: [id number] DOB: 01-Nov-1995  01/20/2020  [name] was observed post Covid-19 immunization for 15 minutes without incident. She was provided with Vaccine Information Sheet and instruction to access the V-Safe system.   [name] was instructed to call 911 with any severe reactions post vaccine: Marland Kitchen Difficulty breathing  . Swelling of face and throat  . A fast heartbeat  . A bad rash all over body  . Dizziness and weakness   Immunizations Administered    Name Date Dose VIS Date Route   Pfizer COVID-19 Vaccine 01/20/2020  1:24 PM 0.3 mL 11/25/2018 Intramuscular   Manufacturer: [employer], [employer]   Lot: [id number]   NDC: [address]-9

## 2020-01-28 ENCOUNTER — Other Ambulatory Visit: Payer: Self-pay | Admitting: Obstetrics and Gynecology

## 2020-01-28 MED ORDER — FLUCONAZOLE 150 MG PO TABS: 150.0000 mg | ORAL_TABLET | Freq: Once | ORAL | 0 refills | Status: AC

## 2020-02-04 ENCOUNTER — Encounter: Payer: Self-pay | Admitting: Internal Medicine

## 2020-02-15 ENCOUNTER — Ambulatory Visit: Payer: Self-pay | Admitting: Internal Medicine

## 2020-03-10 ENCOUNTER — Other Ambulatory Visit: Payer: Self-pay

## 2020-03-10 ENCOUNTER — Ambulatory Visit (INDEPENDENT_AMBULATORY_CARE_PROVIDER_SITE_OTHER): Payer: BC Managed Care – PPO | Admitting: Obstetrics

## 2020-03-10 ENCOUNTER — Encounter: Payer: Self-pay | Admitting: Obstetrics

## 2020-03-10 VITALS — BP 110/70 | Ht 66.0 in | Wt 135.2 lb

## 2020-03-10 DIAGNOSIS — N898 Other specified noninflammatory disorders of vagina: Secondary | ICD-10-CM | POA: Diagnosis not present

## 2020-03-10 LAB — POCT WET PREP (WET MOUNT)
Clue Cells Wet Prep Whiff POC: NEGATIVE
Trichomonas Wet Prep HPF POC: ABSENT

## 2020-03-10 MED ORDER — CLOTRIMAZOLE-BETAMETHASONE 1-0.05 % EX CREA: 1.0000 "application " | TOPICAL_CREAM | Freq: Two times a day (BID) | CUTANEOUS | 0 refills | Status: DC

## 2020-03-10 NOTE — Patient Instructions (Signed)
Vaginal Yeast Infection, Adult  Vaginal yeast infection is a condition that causes vaginal discharge as well as soreness, swelling, and redness (inflammation) of the vagina. This is a common condition. Some women get this infection frequently. What are the causes? This condition is caused by a change in the normal balance of the yeast (candida) and bacteria that live in the vagina. This change causes an overgrowth of yeast, which causes the inflammation. What increases the risk? The condition is more likely to develop in women who:  Take antibiotic medicines.  Have diabetes.  Take birth control pills.  Are pregnant.  Douche often.  Have a weak body defense system (immune system).  Have been taking steroid medicines for a long time.  Frequently wear tight clothing. What are the signs or symptoms? Symptoms of this condition include:  White, thick, creamy vaginal discharge.  Swelling, itching, redness, and irritation of the vagina. The lips of the vagina (vulva) may be affected as well.  Pain or a burning feeling while urinating.  Pain during sex. How is this diagnosed? This condition is diagnosed based on:  Your medical history.  A physical exam.  A pelvic exam. Your health care provider will examine a sample of your vaginal discharge under a microscope. Your health care provider may send this sample for testing to confirm the diagnosis. How is this treated? This condition is treated with medicine. Medicines may be over-the-counter or prescription. You may be told to use one or more of the following:  Medicine that is taken by mouth (orally).  Medicine that is applied as a cream (topically).  Medicine that is inserted directly into the vagina (suppository). Follow these instructions at home:  Lifestyle  Do not have sex until your health care provider approves. Tell your sex partner that you have a yeast infection. That person should go to his or her health care  provider and ask if they should also be treated.  Do not wear tight clothes, such as pantyhose or tight pants.  Wear breathable cotton underwear. General instructions  Take or apply over-the-counter and prescription medicines only as told by your health care provider.  Eat more yogurt. This may help to keep your yeast infection from returning.  Do not use tampons until your health care provider approves.  Try taking a sitz bath to help with discomfort. This is a warm water bath that is taken while you are sitting down. The water should only come up to your hips and should cover your buttocks. Do this 3-4 times per day or as told by your health care provider.  Do not douche.  If you have diabetes, keep your blood sugar levels under control.  Keep all follow-up visits as told by your health care provider. This is important. Contact a health care provider if:  You have a fever.  Your symptoms go away and then return.  Your symptoms do not get better with treatment.  Your symptoms get worse.  You have new symptoms.  You develop blisters in or around your vagina.  You have blood coming from your vagina and it is not your menstrual period.  You develop pain in your abdomen. Summary  Vaginal yeast infection is a condition that causes discharge as well as soreness, swelling, and redness (inflammation) of the vagina.  This condition is treated with medicine. Medicines may be over-the-counter or prescription.  Take or apply over-the-counter and prescription medicines only as told by your health care provider.  Do not douche.   Do not have sex or use tampons until your health care provider approves.  Contact a health care provider if your symptoms do not get better with treatment or your symptoms go away and then return. This information is not intended to replace advice given to you by your health care provider. Make sure you discuss any questions you have with your health care  provider. Document Revised: 04/17/2019 Document Reviewed: 02/03/2018 Elsevier Patient Education  2020 Elsevier Inc.  

## 2020-03-10 NOTE — Telephone Encounter (Signed)
Phone number on file voicemail not set up.

## 2020-03-10 NOTE — Progress Notes (Signed)
[name] is a 24 y.o. G0P0000 who LMP was Patient's last menstrual period was 02/29/2020., presents today for a problem visit.   Patient complains of an abnormal vaginal discharge for 4 days. Discharge described as: normal and physiologic and scant.She descirbes more external irritation than itching or discharge. [place] form [place] vacation. She does not wear much synthetic underwear or thongs. Last IC was a few days ago. She c/o irritation with penetration. Vaginal symptoms include local irritation and vulvar itching.   Other associated symptoms: none.Menstrual pattern: She had been bleeding regularly. Contraception: OCP (estrogen/progesterone).  She denies recent antibiotic exposure, denies changes in soaps, detergents coinciding with the onset of her symptoms.  She has not previously self treated or been under treatment by another provider for these symptoms.   1) Risk factors for bacterial vaginosis and candida infections discussed.  We discussed normal vaginal flora/microbiome.  Any factors that may alter the microbiome increase the risk of these opportunistic infections.  These include changes in pH, antibiotic exposures, diabetes, wet bathing suits etc.  We discussed that treatment is aimed at eradicating abnormal bacterial overgrowth and or yeast.  There may be some role for vaginal probiotics in restoring normal vaginal flora.   Wet mount: shows some rare hyphae, negative whiff, few clue cells, + yeast buds.  Exam: external genitalia: shaves entire mons, No rashes or lesions or visible discharge. Some slightly ruddy area running bilaterally along her labia majora and minora  A: yeast dermatitis P Rx for Mycolog or substitute provided  RTC PRN.  Mirna Mires, CNM  03/10/2020 2:51 PM

## 2020-07-13 ENCOUNTER — Encounter: Payer: Self-pay | Admitting: Internal Medicine

## 2020-09-27 ENCOUNTER — Encounter: Payer: Self-pay | Admitting: Internal Medicine

## 2020-12-10 ENCOUNTER — Other Ambulatory Visit: Payer: Self-pay | Admitting: Obstetrics and Gynecology

## 2020-12-14 ENCOUNTER — Telehealth: Payer: Self-pay

## 2020-12-14 NOTE — Telephone Encounter (Signed)
-----   Message from Vena Austria, MD sent at 12/12/2020 11:15 AM EDT ----- Regarding: Annual Needs annual in the next 1-2 months

## 2020-12-14 NOTE — Telephone Encounter (Signed)
Patient is scheduled for 12/19/20 with [facility]

## 2020-12-19 ENCOUNTER — Ambulatory Visit: Payer: BC Managed Care – PPO | Admitting: Obstetrics

## 2020-12-27 DIAGNOSIS — M25569 Pain in unspecified knee: Secondary | ICD-10-CM | POA: Diagnosis not present

## 2020-12-27 DIAGNOSIS — M9906 Segmental and somatic dysfunction of lower extremity: Secondary | ICD-10-CM | POA: Diagnosis not present

## 2020-12-27 DIAGNOSIS — M9905 Segmental and somatic dysfunction of pelvic region: Secondary | ICD-10-CM | POA: Diagnosis not present

## 2020-12-27 DIAGNOSIS — M5459 Other low back pain: Secondary | ICD-10-CM | POA: Diagnosis not present

## 2020-12-29 ENCOUNTER — Ambulatory Visit: Payer: BC Managed Care – PPO | Admitting: Obstetrics

## 2021-01-03 DIAGNOSIS — M25569 Pain in unspecified knee: Secondary | ICD-10-CM | POA: Diagnosis not present

## 2021-01-03 DIAGNOSIS — M5459 Other low back pain: Secondary | ICD-10-CM | POA: Diagnosis not present

## 2021-01-03 DIAGNOSIS — M9905 Segmental and somatic dysfunction of pelvic region: Secondary | ICD-10-CM | POA: Diagnosis not present

## 2021-01-03 DIAGNOSIS — M9906 Segmental and somatic dysfunction of lower extremity: Secondary | ICD-10-CM | POA: Diagnosis not present

## 2021-01-05 DIAGNOSIS — M25569 Pain in unspecified knee: Secondary | ICD-10-CM | POA: Diagnosis not present

## 2021-01-05 DIAGNOSIS — M9906 Segmental and somatic dysfunction of lower extremity: Secondary | ICD-10-CM | POA: Diagnosis not present

## 2021-01-05 DIAGNOSIS — M5459 Other low back pain: Secondary | ICD-10-CM | POA: Diagnosis not present

## 2021-01-05 DIAGNOSIS — M9905 Segmental and somatic dysfunction of pelvic region: Secondary | ICD-10-CM | POA: Diagnosis not present

## 2021-01-19 DIAGNOSIS — M25569 Pain in unspecified knee: Secondary | ICD-10-CM | POA: Diagnosis not present

## 2021-01-19 DIAGNOSIS — M9906 Segmental and somatic dysfunction of lower extremity: Secondary | ICD-10-CM | POA: Diagnosis not present

## 2021-01-19 DIAGNOSIS — M9905 Segmental and somatic dysfunction of pelvic region: Secondary | ICD-10-CM | POA: Diagnosis not present

## 2021-01-19 DIAGNOSIS — M5459 Other low back pain: Secondary | ICD-10-CM | POA: Diagnosis not present

## 2021-04-30 ENCOUNTER — Other Ambulatory Visit: Payer: Self-pay | Admitting: Obstetrics and Gynecology

## 2021-06-28 ENCOUNTER — Ambulatory Visit: Payer: BC Managed Care – PPO | Admitting: Obstetrics and Gynecology

## 2021-07-14 ENCOUNTER — Encounter: Payer: Self-pay | Admitting: Internal Medicine

## 2021-07-17 ENCOUNTER — Encounter: Payer: Self-pay | Admitting: Obstetrics and Gynecology

## 2021-07-17 ENCOUNTER — Other Ambulatory Visit (HOSPITAL_COMMUNITY)
Admission: RE | Admit: 2021-07-17 | Discharge: 2021-07-17 | Disposition: A | Discharge status: Home / Self Care | Payer: 59 | Source: Ambulatory Visit | Attending: Obstetrics and Gynecology | Admitting: Obstetrics and Gynecology

## 2021-07-17 ENCOUNTER — Other Ambulatory Visit: Payer: Self-pay

## 2021-07-17 ENCOUNTER — Ambulatory Visit (INDEPENDENT_AMBULATORY_CARE_PROVIDER_SITE_OTHER): Payer: 59 | Admitting: Obstetrics and Gynecology

## 2021-07-17 VITALS — BP 110/80 | Ht 67.0 in | Wt 142.0 lb

## 2021-07-17 DIAGNOSIS — Z3009 Encounter for other general counseling and advice on contraception: Secondary | ICD-10-CM | POA: Diagnosis not present

## 2021-07-17 DIAGNOSIS — Z113 Encounter for screening for infections with a predominantly sexual mode of transmission: Secondary | ICD-10-CM | POA: Insufficient documentation

## 2021-07-17 DIAGNOSIS — Z124 Encounter for screening for malignant neoplasm of cervix: Secondary | ICD-10-CM

## 2021-07-17 NOTE — Progress Notes (Signed)
Pcp, No   Chief Complaint  Patient presents with   STD testing    HPI:      [name] is a 25 y.o. G0P0000 whose LMP was Patient's last menstrual period was 07/03/2021 (approximate)., presents today for STD testing. No vag sx, no known exposures, not recently sex active. Plans to have new partner and wants to be safe. Hx of trich in past. No recent pap.  Menses are monthly, lasting 4 days, no BTB, mild dysmen. Wants to consider [facility] options. Did OCPs in past but had to change brands periodically due to BTB.  Has annual appt 07/25/21.   Past Medical History:  Diagnosis Date   Birth control    Nervous stomach    no red meat    Smoker     Past Surgical History:  Procedure Laterality Date   NO PAST SURGERIES      Family History  Problem Relation Age of Onset   Cancer Father    Diabetes Maternal Aunt    Cancer Paternal Uncle    Diabetes Maternal Grandmother    Cancer Cousin    Cancer Cousin    Cancer Other     Social History   Socioeconomic History   Marital status: Single    Spouse name: Not on file   Number of children: Not on file   Years of education: Not on file   Highest education level: Not on file  Occupational History   Not on file  Tobacco Use   Smoking status: Former    Packs/day: 0.25    Years: 5.00    Pack years: 1.25    Types: Cigarettes   Smokeless tobacco: Never  Vaping Use   Vaping Use: Never used  Substance and Sexual Activity   Alcohol use: No    Alcohol/week: 0.0 standard drinks   Drug use: No   Sexual activity: Yes    Birth control/protection: None, Condom  Other Topics Concern   Not on file  Social History Narrative   Working full time [employer]   Social Determinants of Corporate investment banker Strain: Not on file  Food Insecurity: Not on file  Transportation Needs: Not on file  Physical Activity: Not on file  Stress: Not on file  Social Connections: Not on file  Intimate Partner Violence: Not on file     Outpatient Medications Prior to Visit  Medication Sig Dispense Refill   clotrimazole-betamethasone (LOTRISONE) cream Apply 1 application topically 2 (two) times daily. (Patient not taking: Reported on 07/17/2021) 30 g 0   NIKKI 3-0.02 MG tablet TAKE 1 TABLET BY MOUTH EVERY DAY (Patient not taking: Reported on 07/17/2021) 84 tablet 0   No facility-administered medications prior to visit.      ROS:  Review of Systems  Constitutional:  Negative for fever.  Gastrointestinal:  Negative for blood in stool, constipation, diarrhea, nausea and vomiting.  Genitourinary:  Negative for dyspareunia, dysuria, flank pain, frequency, hematuria, urgency, vaginal bleeding, vaginal discharge and vaginal pain.  Musculoskeletal:  Negative for back pain.  Skin:  Negative for rash.  BREAST: No symptoms   OBJECTIVE:   Vitals:  BP 110/80   Ht 5\' 7"  (1.702 m)   Wt 142 lb (64.4 kg)   LMP 07/03/2021 (Approximate)   BMI 22.24 kg/m   Physical Exam Vitals reviewed.  Constitutional:      Appearance: She is well-developed.  Pulmonary:     Effort: Pulmonary effort is normal.  Genitourinary:    General: Normal vulva.     Pubic Area: No rash.      Labia:        Right: No rash, tenderness or lesion.        Left: No rash, tenderness or lesion.      Vagina: Normal. No vaginal discharge, erythema or tenderness.     Cervix: Normal.     Uterus: Normal. Not enlarged and not tender.      Adnexa: Right adnexa normal and left adnexa normal.       Right: No mass or tenderness.         Left: No mass or tenderness.    Musculoskeletal:        General: Normal range of motion.     Cervical back: Normal range of motion.  Skin:    General: Skin is warm and dry.  Neurological:     General: No focal deficit present.     Mental Status: She is alert and oriented to person, place, and time.  Psychiatric:        Mood and Affect: Mood normal.        Behavior: Behavior normal.        Thought Content: Thought  content normal.        Judgment: Judgment normal.    Assessment/Plan: Cervical cancer screening - Plan: Cytology - PAP  Screening for STD (sexually transmitted disease) - Plan: HIV Antibody (routine testing w rflx), RPR, HSV 2 antibody, IgG, Hepatitis C antibody, Cytology - PAP  Encounter for other general counseling or advice on contraception--BC options discussed. May do better with patch/nuvaring for constant hormone dosing daily due to small fluctuations in pills that can cause BTB. Pt to consider options and f/u at annual.     Return if symptoms worsen or fail to improve.  Zachery Niswander B. Trust Crago, PA-C 07/17/2021 11:46 AM

## 2021-07-18 ENCOUNTER — Encounter: Payer: Self-pay | Admitting: Obstetrics and Gynecology

## 2021-07-18 LAB — HEPATITIS C ANTIBODY: Hep C Virus Ab: <0.1 s/co ratio (ref 0.0–0.9)

## 2021-07-18 LAB — HIV ANTIBODY (ROUTINE TESTING W REFLEX): HIV Screen 4th Generation wRfx: NONREACTIVE

## 2021-07-18 LAB — HSV 2 ANTIBODY, IGG: HSV 2 IgG, Type Spec: <0.91 index (ref 0.00–0.90)

## 2021-07-18 LAB — RPR: RPR Ser Ql: NONREACTIVE

## 2021-07-19 LAB — CYTOLOGY - PAP
Chlamydia: NEGATIVE
Comment: NEGATIVE
Comment: NEGATIVE
Comment: NORMAL
Diagnosis: NEGATIVE
Neisseria Gonorrhea: NEGATIVE
Trichomonas: NEGATIVE

## 2021-07-25 ENCOUNTER — Ambulatory Visit: Payer: 59 | Admitting: Obstetrics

## 2021-07-31 ENCOUNTER — Encounter: Payer: Self-pay | Admitting: Internal Medicine

## 2021-08-02 ENCOUNTER — Ambulatory Visit: Payer: 59 | Admitting: Obstetrics and Gynecology

## 2021-08-03 ENCOUNTER — Ambulatory Visit: Payer: 59 | Admitting: Obstetrics and Gynecology

## 2021-08-08 ENCOUNTER — Ambulatory Visit: Payer: 59 | Admitting: Obstetrics and Gynecology

## 2021-08-09 ENCOUNTER — Other Ambulatory Visit (HOSPITAL_COMMUNITY)
Admission: RE | Admit: 2021-08-09 | Discharge: 2021-08-09 | Disposition: A | Discharge status: Home / Self Care | Payer: 59 | Source: Ambulatory Visit | Attending: Obstetrics and Gynecology | Admitting: Obstetrics and Gynecology

## 2021-08-09 ENCOUNTER — Encounter: Payer: Self-pay | Admitting: Obstetrics and Gynecology

## 2021-08-09 ENCOUNTER — Other Ambulatory Visit: Payer: Self-pay

## 2021-08-09 ENCOUNTER — Ambulatory Visit (INDEPENDENT_AMBULATORY_CARE_PROVIDER_SITE_OTHER): Payer: 59 | Admitting: Obstetrics and Gynecology

## 2021-08-09 VITALS — BP 100/60 | Ht 67.0 in | Wt 142.0 lb

## 2021-08-09 DIAGNOSIS — Z3041 Encounter for surveillance of contraceptive pills: Secondary | ICD-10-CM

## 2021-08-09 DIAGNOSIS — N930 Postcoital and contact bleeding: Secondary | ICD-10-CM | POA: Insufficient documentation

## 2021-08-09 DIAGNOSIS — Z113 Encounter for screening for infections with a predominantly sexual mode of transmission: Secondary | ICD-10-CM | POA: Insufficient documentation

## 2021-08-09 MED ORDER — DROSPIRENONE-ETHINYL ESTRADIOL 3-0.02 MG PO TABS: 1.0000 | ORAL_TABLET | Freq: Every day | ORAL | 3 refills | Status: DC

## 2021-08-09 NOTE — Progress Notes (Signed)
Pcp, No   Chief Complaint  Patient presents with   Follow-up    BC, bleeding after intercourse x 3 weeks, no pain during intercourse    HPI:      [name] is a 25 y.o. G0P0000 whose LMP was No LMP recorded., presents today for PC bleeding for the past 3 wks. Blood is pinkish/red, starts after sex and lasts till the next day. No pain with sex. Neg STD testing 07/17/21 but has a new partner since then. No vag sx.  Also restarted OCPs this cycle, before becoming sex active. Used condoms the first wk of pills. Has a hx of BTB with OCPs in past but pt thinks this bleeding is different since no sx if not sex active. Needs Rx RF. Did pap 10/22.   Past Medical History:  Diagnosis Date   Birth control    Nervous stomach    no red meat    Smoker     Past Surgical History:  Procedure Laterality Date   NO PAST SURGERIES      Family History  Problem Relation Age of Onset   Cancer Father    Diabetes Maternal Aunt    Cancer Paternal Uncle    Diabetes Maternal Grandmother    Cancer Cousin    Cancer Cousin    Cancer Other     Social History   Socioeconomic History   Marital status: Single    Spouse name: Not on file   Number of children: Not on file   Years of education: Not on file   Highest education level: Not on file  Occupational History   Not on file  Tobacco Use   Smoking status: Former    Packs/day: 0.25    Years: 5.00    Pack years: 1.25    Types: Cigarettes   Smokeless tobacco: Never  Vaping Use   Vaping Use: Never used  Substance and Sexual Activity   Alcohol use: No    Alcohol/week: 0.0 standard drinks   Drug use: No   Sexual activity: Yes    Birth control/protection: None, Condom  Other Topics Concern   Not on file  Social History Narrative   Working full time [employer]   Social Determinants of Radio broadcast assistant Strain: Not on file  Food Insecurity: Not on file  Transportation Needs: Not on file  Physical Activity:  Not on file  Stress: Not on file  Social Connections: Not on file  Intimate Partner Violence: Not on file    Outpatient Medications Prior to Visit  Medication Sig Dispense Refill   NIKKI 3-0.02 MG tablet TAKE 1 TABLET BY MOUTH EVERY DAY 84 tablet 0   clotrimazole-betamethasone (LOTRISONE) cream Apply 1 application topically 2 (two) times daily. (Patient not taking: Reported on 07/17/2021) 30 g 0   No facility-administered medications prior to visit.      ROS:  Review of Systems  Constitutional:  Negative for fever.  Gastrointestinal:  Negative for blood in stool, constipation, diarrhea, nausea and vomiting.  Genitourinary:  Positive for vaginal bleeding. Negative for dyspareunia, dysuria, flank pain, frequency, hematuria, urgency, vaginal discharge and vaginal pain.  Musculoskeletal:  Negative for back pain.  Skin:  Negative for rash.  BREAST: No symptoms   OBJECTIVE:   Vitals:  BP 100/60   Ht 5\' 7"  (1.702 m)   Wt 142 lb (64.4 kg)   BMI 22.24 kg/m   Physical Exam Vitals reviewed.  Constitutional:  Appearance: She is well-developed.  Pulmonary:     Effort: Pulmonary effort is normal.  Genitourinary:    General: Normal vulva.     Pubic Area: No rash.      Labia:        Right: No rash, tenderness or lesion.        Left: No rash, tenderness or lesion.      Vagina: Normal. No vaginal discharge, erythema, tenderness or bleeding.     Cervix: No cervical motion tenderness or friability.     Uterus: Normal. Not enlarged and not tender.      Adnexa: Right adnexa normal and left adnexa normal.       Right: No mass or tenderness.         Left: No mass or tenderness.    Musculoskeletal:        General: Normal range of motion.     Cervical back: Normal range of motion.  Skin:    General: Skin is warm and dry.  Neurological:     General: No focal deficit present.     Mental Status: She is alert and oriented to person, place, and time.  Psychiatric:        Mood and  Affect: Mood normal.        Behavior: Behavior normal.        Thought Content: Thought content normal.        Judgment: Judgment normal.    Assessment/Plan: PCB (post coital bleeding) - Plan: Cervicovaginal ancillary only; rule out STDs. If neg, will treat with doxy. Cx not friable today. If still sx after tx, question related to pt adjusting to new OCP start. Will f/u with results.   Screening for STD (sexually transmitted disease) - Plan: Cervicovaginal ancillary only  Encounter for surveillance of contraceptive pills - Plan: drospirenone-ethinyl estradiol (NIKKI) 3-0.02 MG tablet; OCP RF.     Meds ordered this encounter  Medications   drospirenone-ethinyl estradiol (NIKKI) 3-0.02 MG tablet    Sig: Take 1 tablet by mouth daily.    Dispense:  84 tablet    Refill:  3    Order Specific Question:   Supervising Provider    Answer:   Nadara Mustard [[id number]]      Return in about 1 year (around 08/09/2022).  Percy Comp B. Mykell Rawl, PA-C 08/09/2021 11:23 AM

## 2021-08-10 LAB — CERVICOVAGINAL ANCILLARY ONLY
Chlamydia: NEGATIVE
Comment: NEGATIVE
Comment: NEGATIVE
Comment: NORMAL
Neisseria Gonorrhea: NEGATIVE
Trichomonas: NEGATIVE

## 2021-08-10 MED ORDER — DOXYCYCLINE HYCLATE 100 MG PO CAPS: 100.0000 mg | ORAL_CAPSULE | Freq: Two times a day (BID) | ORAL | 0 refills | Status: DC

## 2021-08-10 NOTE — Addendum Note (Signed)
Addended by: Althea Grimmer B on: 08/10/2021 07:05 PM   Modules accepted: Orders

## 2021-09-01 ENCOUNTER — Encounter: Payer: Self-pay | Admitting: Obstetrics and Gynecology

## 2021-09-01 DIAGNOSIS — N930 Postcoital and contact bleeding: Secondary | ICD-10-CM

## 2021-09-18 ENCOUNTER — Other Ambulatory Visit: Payer: Self-pay

## 2021-09-18 ENCOUNTER — Ambulatory Visit
Admission: RE | Admit: 2021-09-18 | Discharge: 2021-09-18 | Disposition: A | Discharge status: Home / Self Care | Payer: 59 | Source: Ambulatory Visit | Attending: Obstetrics and Gynecology | Admitting: Obstetrics and Gynecology

## 2021-09-18 DIAGNOSIS — N930 Postcoital and contact bleeding: Secondary | ICD-10-CM | POA: Diagnosis present

## 2021-09-29 ENCOUNTER — Other Ambulatory Visit: Payer: Self-pay

## 2021-09-29 ENCOUNTER — Encounter: Payer: Self-pay | Admitting: Obstetrics and Gynecology

## 2021-09-29 DIAGNOSIS — Z3041 Encounter for surveillance of contraceptive pills: Secondary | ICD-10-CM

## 2021-09-29 MED ORDER — DROSPIRENONE-ETHINYL ESTRADIOL 3-0.02 MG PO TABS
1.0000 | ORAL_TABLET | Freq: Every day | ORAL | 3 refills | Status: DC
Start: 2021-09-29 — End: 2023-02-11

## 2021-10-11 ENCOUNTER — Encounter: Payer: Self-pay | Admitting: Obstetrics and Gynecology

## 2021-10-11 ENCOUNTER — Other Ambulatory Visit (HOSPITAL_COMMUNITY)
Admission: RE | Admit: 2021-10-11 | Discharge: 2021-10-11 | Disposition: A | Discharge status: Home / Self Care | Payer: 59 | Source: Ambulatory Visit | Attending: Obstetrics and Gynecology | Admitting: Obstetrics and Gynecology

## 2021-10-11 ENCOUNTER — Other Ambulatory Visit: Payer: Self-pay

## 2021-10-11 ENCOUNTER — Ambulatory Visit (INDEPENDENT_AMBULATORY_CARE_PROVIDER_SITE_OTHER): Payer: 59 | Admitting: Obstetrics and Gynecology

## 2021-10-11 VITALS — BP 100/70 | Ht 65.0 in | Wt 140.0 lb

## 2021-10-11 DIAGNOSIS — Z113 Encounter for screening for infections with a predominantly sexual mode of transmission: Secondary | ICD-10-CM | POA: Diagnosis present

## 2021-10-11 DIAGNOSIS — Z20828 Contact with and (suspected) exposure to other viral communicable diseases: Secondary | ICD-10-CM

## 2021-10-11 DIAGNOSIS — N921 Excessive and frequent menstruation with irregular cycle: Secondary | ICD-10-CM | POA: Diagnosis not present

## 2021-10-11 DIAGNOSIS — R69 Illness, unspecified: Secondary | ICD-10-CM | POA: Diagnosis not present

## 2021-10-11 NOTE — Progress Notes (Signed)
Pcp, No   Chief Complaint  Patient presents with   STD testing    HPI:      [name] is a 26 y.o. G0P0000 whose LMP was Patient's last menstrual period was 09/18/2021 (approximate)., presents today for STD testing. Current partner told her he is positive for HSV 1 from cold sores, no hx of genital lesions. Pt doesn't get cold sores. She had neg STD testing 11/22; no new partners. No vag sx, no sx suggestive of prior HSV lesions. Has ingrown hair that is resolving.  Was having PCB with neg STD tesing 11/22 and neg GYN u/s 12/22; no relief with doxy; had just restarted OCPs 10/22. Sx have since resolved. On 3rd pack of pills, still having occas BTB.   Patient Active Problem List   Diagnosis Date Noted   Anxiety 12/24/2017   Menstrual irregularity 11/02/2016   Irritable bowel syndrome (IBS) 05/18/2016   Smoker 05/18/2016    Past Surgical History:  Procedure Laterality Date   NO PAST SURGERIES      Family History  Problem Relation Age of Onset   Bone cancer Father    Diabetes Maternal Aunt    Liver cancer Paternal Uncle    Diabetes Maternal Grandmother    Leukemia Cousin    Brain cancer Cousin        tumor    Social History   Socioeconomic History   Marital status: Single    Spouse name: Not on file   Number of children: Not on file   Years of education: Not on file   Highest education level: Not on file  Occupational History   Not on file  Tobacco Use   Smoking status: Former    Packs/day: 0.25    Years: 5.00    Pack years: 1.25    Types: Cigarettes   Smokeless tobacco: Never  Vaping Use   Vaping Use: Never used  Substance and Sexual Activity   Alcohol use: No    Alcohol/week: 0.0 standard drinks   Drug use: No   Sexual activity: Yes    Birth control/protection: Pill  Other Topics Concern   Not on file  Social History Narrative   Working full time [employer]   Social Determinants of Health   Financial Resource Strain: Not on  file  Food Insecurity: Not on file  Transportation Needs: Not on file  Physical Activity: Not on file  Stress: Not on file  Social Connections: Not on file  Intimate Partner Violence: Not on file    Outpatient Medications Prior to Visit  Medication Sig Dispense Refill   drospirenone-ethinyl estradiol (NIKKI) 3-0.02 MG tablet Take 1 tablet by mouth daily. 84 tablet 3   doxycycline (VIBRAMYCIN) 100 MG capsule Take 1 capsule (100 mg total) by mouth 2 (two) times daily. 14 capsule 0   No facility-administered medications prior to visit.      ROS:  Review of Systems  Constitutional:  Negative for fever.  Gastrointestinal:  Negative for blood in stool, constipation, diarrhea, nausea and vomiting.  Genitourinary:  Negative for dyspareunia, dysuria, flank pain, frequency, hematuria, urgency, vaginal bleeding, vaginal discharge and vaginal pain.  Musculoskeletal:  Negative for back pain.  Skin:  Negative for rash.  BREAST: No symptoms   OBJECTIVE:   Vitals:  BP 100/70    Ht 5\' 5"  (1.651 m)    Wt 140 lb (63.5 kg)    LMP 09/18/2021 (Approximate)    BMI 23.30 kg/m  Physical Exam Vitals reviewed.  Constitutional:      Appearance: She is well-developed.  Pulmonary:     Effort: Pulmonary effort is normal.  Genitourinary:    General: Normal vulva.     Pubic Area: No rash.      Labia:        Right: No rash, tenderness or lesion.        Left: No rash, tenderness or lesion.      Vagina: Normal. No vaginal discharge, erythema or tenderness.     Cervix: Normal.     Uterus: Normal. Not enlarged and not tender.      Adnexa: Right adnexa normal and left adnexa normal.       Right: No mass or tenderness.         Left: No mass or tenderness.      Musculoskeletal:        General: Normal range of motion.     Cervical back: Normal range of motion.  Skin:    General: Skin is warm and dry.  Neurological:     General: No focal deficit present.     Mental Status: She is alert and  oriented to person, place, and time.  Psychiatric:        Mood and Affect: Mood normal.        Behavior: Behavior normal.        Thought Content: Thought content normal.        Judgment: Judgment normal.     Assessment/Plan: Screening for STD (sexually transmitted disease) - Plan: HSV 1 antibody, IgG, Cervicovaginal ancillary only  Exposure to herpes simplex virus (HSV) - Plan: HSV 1 antibody, IgG; check labs per pt request. Discussed most people are Type 1 HSV positive and don't have cold sores. No tx needed if pos. Partner needs to abstain from skin to skin contact if has active cold sore.   Breakthrough bleeding on OCPs--on 3rd pill pack. F/u if sx persist to change pills. Hx of BTB on OCPs in past.   PCB resolved.    Return if symptoms worsen or fail to improve.  Elenor Wildes B. Aquanetta Schwarz, PA-C 10/12/2021 11:43 AM

## 2021-10-12 ENCOUNTER — Encounter: Payer: Self-pay | Admitting: Obstetrics and Gynecology

## 2021-10-12 LAB — HSV 1 ANTIBODY, IGG: HSV 1 Glycoprotein G Ab, IgG: 2.08 index — ABNORMAL HIGH (ref 0.00–0.90)

## 2021-10-13 LAB — CERVICOVAGINAL ANCILLARY ONLY
Chlamydia: NEGATIVE
Comment: NEGATIVE
Comment: NORMAL
Neisseria Gonorrhea: NEGATIVE

## 2022-12-17 DIAGNOSIS — M955 Acquired deformity of pelvis: Secondary | ICD-10-CM | POA: Diagnosis not present

## 2022-12-17 DIAGNOSIS — M9905 Segmental and somatic dysfunction of pelvic region: Secondary | ICD-10-CM | POA: Diagnosis not present

## 2022-12-17 DIAGNOSIS — M9902 Segmental and somatic dysfunction of thoracic region: Secondary | ICD-10-CM | POA: Diagnosis not present

## 2022-12-17 DIAGNOSIS — M531 Cervicobrachial syndrome: Secondary | ICD-10-CM | POA: Diagnosis not present

## 2022-12-17 DIAGNOSIS — M5441 Lumbago with sciatica, right side: Secondary | ICD-10-CM | POA: Diagnosis not present

## 2022-12-17 DIAGNOSIS — M9903 Segmental and somatic dysfunction of lumbar region: Secondary | ICD-10-CM | POA: Diagnosis not present

## 2022-12-17 DIAGNOSIS — M546 Pain in thoracic spine: Secondary | ICD-10-CM | POA: Diagnosis not present

## 2022-12-17 DIAGNOSIS — M9901 Segmental and somatic dysfunction of cervical region: Secondary | ICD-10-CM | POA: Diagnosis not present

## 2022-12-20 DIAGNOSIS — M9901 Segmental and somatic dysfunction of cervical region: Secondary | ICD-10-CM | POA: Diagnosis not present

## 2022-12-20 DIAGNOSIS — M9902 Segmental and somatic dysfunction of thoracic region: Secondary | ICD-10-CM | POA: Diagnosis not present

## 2022-12-20 DIAGNOSIS — M955 Acquired deformity of pelvis: Secondary | ICD-10-CM | POA: Diagnosis not present

## 2022-12-20 DIAGNOSIS — M9905 Segmental and somatic dysfunction of pelvic region: Secondary | ICD-10-CM | POA: Diagnosis not present

## 2022-12-20 DIAGNOSIS — M5441 Lumbago with sciatica, right side: Secondary | ICD-10-CM | POA: Diagnosis not present

## 2022-12-20 DIAGNOSIS — M546 Pain in thoracic spine: Secondary | ICD-10-CM | POA: Diagnosis not present

## 2022-12-20 DIAGNOSIS — M531 Cervicobrachial syndrome: Secondary | ICD-10-CM | POA: Diagnosis not present

## 2022-12-20 DIAGNOSIS — M9903 Segmental and somatic dysfunction of lumbar region: Secondary | ICD-10-CM | POA: Diagnosis not present

## 2022-12-26 DIAGNOSIS — M5441 Lumbago with sciatica, right side: Secondary | ICD-10-CM | POA: Diagnosis not present

## 2022-12-26 DIAGNOSIS — M955 Acquired deformity of pelvis: Secondary | ICD-10-CM | POA: Diagnosis not present

## 2022-12-26 DIAGNOSIS — M9903 Segmental and somatic dysfunction of lumbar region: Secondary | ICD-10-CM | POA: Diagnosis not present

## 2022-12-26 DIAGNOSIS — M9902 Segmental and somatic dysfunction of thoracic region: Secondary | ICD-10-CM | POA: Diagnosis not present

## 2022-12-26 DIAGNOSIS — M546 Pain in thoracic spine: Secondary | ICD-10-CM | POA: Diagnosis not present

## 2022-12-26 DIAGNOSIS — M9901 Segmental and somatic dysfunction of cervical region: Secondary | ICD-10-CM | POA: Diagnosis not present

## 2022-12-26 DIAGNOSIS — M9905 Segmental and somatic dysfunction of pelvic region: Secondary | ICD-10-CM | POA: Diagnosis not present

## 2022-12-26 DIAGNOSIS — M531 Cervicobrachial syndrome: Secondary | ICD-10-CM | POA: Diagnosis not present

## 2023-02-11 ENCOUNTER — Ambulatory Visit (INDEPENDENT_AMBULATORY_CARE_PROVIDER_SITE_OTHER): Payer: 59 | Admitting: Family Medicine

## 2023-02-11 ENCOUNTER — Encounter: Payer: Self-pay | Admitting: Family Medicine

## 2023-02-11 VITALS — BP 102/78 | HR 68 | Ht 67.0 in | Wt 151.8 lb

## 2023-02-11 DIAGNOSIS — F32A Depression, unspecified: Secondary | ICD-10-CM | POA: Diagnosis not present

## 2023-02-11 DIAGNOSIS — R21 Rash and other nonspecific skin eruption: Secondary | ICD-10-CM | POA: Diagnosis not present

## 2023-02-11 DIAGNOSIS — R5382 Chronic fatigue, unspecified: Secondary | ICD-10-CM | POA: Diagnosis not present

## 2023-02-11 DIAGNOSIS — K580 Irritable bowel syndrome with diarrhea: Secondary | ICD-10-CM | POA: Diagnosis not present

## 2023-02-11 DIAGNOSIS — F419 Anxiety disorder, unspecified: Secondary | ICD-10-CM | POA: Diagnosis not present

## 2023-02-11 NOTE — Assessment & Plan Note (Signed)
Chronic, has previously seen therapist. Currently with low energy, see additional assessment(s) for plan details. Reviewed both pharmacologic and nonpharmacologic treatments, plan as follows: - Referral to psychiatry for cognitive behavioral therapy - Patient information provided

## 2023-02-11 NOTE — Progress Notes (Signed)
     Primary Care / Sports Medicine Office Visit  Patient Information:  Patient ID: Tammy Pena, female DOB: 09-01-1996 Age: 27 y.o. MRN: [id number]   Tammy Pena is a pleasant 27 y.o. female presenting with the following:  Chief Complaint  Patient presents with   Establish Care    Vitals:   02/11/23 1354  BP: 102/78  Pulse: 68  SpO2: 98%   Vitals:   02/11/23 1354  Weight: 151 lb 12.8 oz (68.9 kg)  Height: 5\' 7"  (1.702 m)   Body mass index is 23.78 kg/m.  No results found.   Independent interpretation of notes and tests performed by another provider:   None  Procedures performed:   None  Pertinent History, Exam, Impression, and Recommendations:   [name] was seen today for establish care.  Facial rash Assessment & Plan: Chronic, years, progressive to left upper eye. Appears to be actinic keratoses, no red flag symptoms. Interested in dermarology input, referral placed today.  Orders: -     Ambulatory referral to Dermatology  Irritable bowel syndrome with diarrhea Assessment & Plan: Chronic, controlled with probiotics.   Depression, unspecified depression type -     Ambulatory referral to Psychiatry  Chronic fatigue Assessment & Plan: In the setting of active depression, plan for labs at return to assess electrolytes, thyroid, vitamin D, iron, comorbid treatment of depression/anxiety.   Anxiety and depression Assessment & Plan: Chronic, has previously seen therapist. Currently with low energy, see additional assessment(s) for plan details. Reviewed both pharmacologic and nonpharmacologic treatments, plan as follows: - Referral to psychiatry for cognitive behavioral therapy - Patient information provided       Orders & Medications No orders of the defined types were placed in this encounter.  Orders Placed This Encounter  Procedures   Ambulatory referral to Dermatology   Ambulatory referral to Psychiatry     No follow-ups on  file.     Jerrol Banana, MD, [facility]   Primary Care Sports Medicine Primary Care and Sports Medicine at [facility]

## 2023-02-11 NOTE — Assessment & Plan Note (Signed)
In the setting of active depression, plan for labs at return to assess electrolytes, thyroid, vitamin D, iron, comorbid treatment of depression/anxiety.

## 2023-02-11 NOTE — Patient Instructions (Signed)
-   Review information attached - Referral coordinator will contact you tos schedule visits with psychiatry and dermatology - Return for physical

## 2023-02-11 NOTE — Assessment & Plan Note (Signed)
Chronic, years, progressive to left upper eye. Appears to be actinic keratoses, no red flag symptoms. Interested in dermarology input, referral placed today.

## 2023-02-11 NOTE — Assessment & Plan Note (Signed)
Chronic, controlled with probiotics.

## 2023-02-12 DIAGNOSIS — M531 Cervicobrachial syndrome: Secondary | ICD-10-CM | POA: Diagnosis not present

## 2023-02-12 DIAGNOSIS — M9901 Segmental and somatic dysfunction of cervical region: Secondary | ICD-10-CM | POA: Diagnosis not present

## 2023-02-12 DIAGNOSIS — M955 Acquired deformity of pelvis: Secondary | ICD-10-CM | POA: Diagnosis not present

## 2023-02-12 DIAGNOSIS — M5441 Lumbago with sciatica, right side: Secondary | ICD-10-CM | POA: Diagnosis not present

## 2023-02-12 DIAGNOSIS — M9905 Segmental and somatic dysfunction of pelvic region: Secondary | ICD-10-CM | POA: Diagnosis not present

## 2023-02-12 DIAGNOSIS — M9902 Segmental and somatic dysfunction of thoracic region: Secondary | ICD-10-CM | POA: Diagnosis not present

## 2023-02-12 DIAGNOSIS — M9903 Segmental and somatic dysfunction of lumbar region: Secondary | ICD-10-CM | POA: Diagnosis not present

## 2023-02-12 DIAGNOSIS — M546 Pain in thoracic spine: Secondary | ICD-10-CM | POA: Diagnosis not present

## 2023-02-14 ENCOUNTER — Ambulatory Visit (INDEPENDENT_AMBULATORY_CARE_PROVIDER_SITE_OTHER): Payer: 59 | Admitting: Family Medicine

## 2023-02-14 ENCOUNTER — Encounter: Payer: Self-pay | Admitting: Family Medicine

## 2023-02-14 VITALS — BP 112/78 | HR 78 | Ht 67.0 in | Wt 153.0 lb

## 2023-02-14 DIAGNOSIS — Z0000 Encounter for general adult medical examination without abnormal findings: Secondary | ICD-10-CM

## 2023-02-14 DIAGNOSIS — Z13220 Encounter for screening for lipoid disorders: Secondary | ICD-10-CM | POA: Diagnosis not present

## 2023-02-14 DIAGNOSIS — E559 Vitamin D deficiency, unspecified: Secondary | ICD-10-CM

## 2023-02-14 DIAGNOSIS — Z23 Encounter for immunization: Secondary | ICD-10-CM | POA: Diagnosis not present

## 2023-02-15 LAB — LIPID PANEL
Chol/HDL Ratio: 2.3 ratio (ref 0.0–4.4)
Cholesterol, Total: 173 mg/dL (ref 100–199)
HDL: 74 mg/dL (ref >39)
LDL Chol Calc (NIH): 89 mg/dL (ref 0–99)
Triglycerides: 50 mg/dL (ref 0–149)
VLDL Cholesterol Cal: 10 mg/dL (ref 5–40)

## 2023-02-15 LAB — COMPREHENSIVE METABOLIC PANEL
ALT: 13 IU/L (ref 0–32)
AST: 18 IU/L (ref 0–40)
Albumin/Globulin Ratio: 2.5 — ABNORMAL HIGH (ref 1.2–2.2)
Albumin: 4.7 g/dL (ref 4.0–5.0)
Alkaline Phosphatase: 59 IU/L (ref 44–121)
BUN/Creatinine Ratio: 11 (ref 9–23)
BUN: 11 mg/dL (ref 6–20)
Bilirubin Total: 0.6 mg/dL (ref 0.0–1.2)
CO2: 21 mmol/L (ref 20–29)
Calcium: 9.6 mg/dL (ref 8.7–10.2)
Chloride: 103 mmol/L (ref 96–106)
Creatinine, Ser: 0.97 mg/dL (ref 0.57–1.00)
Globulin, Total: 1.9 g/dL (ref 1.5–4.5)
Glucose: 92 mg/dL (ref 70–99)
Potassium: 4.8 mmol/L (ref 3.5–5.2)
Sodium: 139 mmol/L (ref 134–144)
Total Protein: 6.6 g/dL (ref 6.0–8.5)
eGFR: 82 mL/min/{1.73_m2} (ref >59)

## 2023-02-15 LAB — VITAMIN D 25 HYDROXY (VIT D DEFICIENCY, FRACTURES): Vit D, 25-Hydroxy: 31.2 ng/mL (ref 30.0–100.0)

## 2023-02-15 LAB — CBC
Hematocrit: 40.7 % (ref 34.0–46.6)
Hemoglobin: 13.3 g/dL (ref 11.1–15.9)
MCH: 30.7 pg (ref 26.6–33.0)
MCHC: 32.7 g/dL (ref 31.5–35.7)
MCV: 94 fL (ref 79–97)
Platelets: 189 10*3/uL (ref 150–450)
RBC: 4.33 x10E6/uL (ref 3.77–5.28)
RDW: 12.3 % (ref 11.7–15.4)
WBC: 5.2 10*3/uL (ref 3.4–10.8)

## 2023-02-15 LAB — TSH: TSH: 1.13 u[IU]/mL (ref 0.450–4.500)

## 2023-02-19 ENCOUNTER — Encounter: Payer: Self-pay | Admitting: Family Medicine

## 2023-02-19 DIAGNOSIS — Z0000 Encounter for general adult medical examination without abnormal findings: Secondary | ICD-10-CM | POA: Insufficient documentation

## 2023-02-19 NOTE — Assessment & Plan Note (Signed)
Annual examination completed, risk stratification labs ordered, anticipatory guidance provided.  We will follow labs once resulted. 

## 2023-02-19 NOTE — Progress Notes (Signed)
Annual Physical Exam Visit  Patient Information:  Patient ID: Tammy Pena, female DOB: 23-Feb-1996 Age: 27 y.o. MRN: [id number]   Subjective:   CC: Annual Physical Exam  HPI:  Tammy Pena is here for their annual physical.  I reviewed the past medical history, family history, social history, surgical history, and allergies today and changes were made as necessary.  Please see the problem list section below for additional details.  Past Medical History: Past Medical History:  Diagnosis Date   Birth control    Nervous stomach    no red meat    Smoker    Smoker 05/18/2016   Past Surgical History: Past Surgical History:  Procedure Laterality Date   NO PAST SURGERIES     Family History: Family History  Problem Relation Age of Onset   Hypertension Mother    Bone cancer Father 26   Diabetes Maternal Grandmother    Diabetes Maternal Aunt    Liver cancer Paternal Uncle    Leukemia Cousin    Brain cancer Cousin        tumor   Allergies: No Known Allergies Health Maintenance: Health Maintenance  Topic Date Due   COVID-19 Vaccine (3 - 2023-24 season) 06/01/2022   INFLUENZA VACCINE  05/02/2023   PAP-Cervical Cytology Screening  07/17/2024   PAP SMEAR-Modifier  07/17/2024   DTaP/Tdap/Td (2 - Td or Tdap) 02/13/2033   Hepatitis C Screening  Completed   HIV Screening  Completed   HPV VACCINES  Aged Out    HM Colonoscopy     This patient has no relevant Health Maintenance data.      Medications: No current outpatient medications on file prior to visit.   No current facility-administered medications on file prior to visit.    Review of Systems: No headache, visual changes, nausea, vomiting, diarrhea, constipation, dizziness, abdominal pain, skin rash, fevers, chills, night sweats, swollen lymph nodes, weight loss, chest pain, body aches, joint swelling, muscle aches, shortness of breath, mood changes, visual or auditory hallucinations  reported.  Objective:   Vitals:   02/14/23 1034  BP: 112/78  Pulse: 78  SpO2: 98%   Vitals:   02/14/23 1034  Weight: 153 lb (69.4 kg)  Height: 5\' 7"  (1.702 m)   Body mass index is 23.96 kg/m.  General: Well Developed, well nourished, and in no acute distress.  Neuro: Alert and oriented x3, extra-ocular muscles intact, sensation grossly intact. Cranial nerves II through XII are grossly intact, motor, sensory, and coordinative functions are intact. HEENT: Normocephalic, atraumatic, pupils equal round reactive to light, neck supple, no masses, no lymphadenopathy, thyroid nonpalpable. Oropharynx, nasopharynx, external ear canals are unremarkable. Skin: Warm and dry, no rashes noted.  Cardiac: Regular rate and rhythm, no murmurs rubs or gallops. No peripheral edema. Pulses symmetric. Respiratory: Clear to auscultation bilaterally. Not using accessory muscles, speaking in full sentences.  Abdominal: Soft, nontender, nondistended, positive bowel sounds, no masses, no organomegaly. Musculoskeletal: Shoulder, elbow, wrist, hip, knee, ankle stable, and with full range of motion.  Female chaperone initials: KG present throughout the physical examination.  Impression and Recommendations:   The patient was counselled, risk factors were discussed, and anticipatory guidance given.  Problem List Items Addressed This Visit       Other   Healthcare maintenance - Primary    Annual examination completed, risk stratification labs ordered, anticipatory guidance provided.  We will follow labs once resulted.      Relevant Orders   CBC (Completed)  Comprehensive metabolic panel (Completed)   Lipid panel (Completed)   TSH (Completed)   VITAMIN D 25 Hydroxy (Vit-D Deficiency, Fractures) (Completed)   Tdap vaccine greater than or equal to 7yo IM (Completed)   Other Visit Diagnoses     Annual physical exam       Relevant Orders   CBC (Completed)   Comprehensive metabolic panel (Completed)    Lipid panel (Completed)   TSH (Completed)   VITAMIN D 25 Hydroxy (Vit-D Deficiency, Fractures) (Completed)   Tdap vaccine greater than or equal to 7yo IM (Completed)   Vitamin D deficiency       Relevant Orders   VITAMIN D 25 Hydroxy (Vit-D Deficiency, Fractures) (Completed)   Screening for lipoid disorders       Relevant Orders   Comprehensive metabolic panel (Completed)   Lipid panel (Completed)   Need for diphtheria-tetanus-pertussis (Tdap) vaccine       Relevant Orders   Tdap vaccine greater than or equal to 7yo IM (Completed)        Orders & Medications Medications: No orders of the defined types were placed in this encounter.  Orders Placed This Encounter  Procedures   Tdap vaccine greater than or equal to 7yo IM   CBC   Comprehensive metabolic panel   Lipid panel   TSH   VITAMIN D 25 Hydroxy (Vit-D Deficiency, Fractures)     No follow-ups on file.    Jerrol Banana, MD, [facility]   Primary Care Sports Medicine Primary Care and Sports Medicine at [facility]

## 2023-02-19 NOTE — Patient Instructions (Signed)
-   Obtain fasting labs with orders provided (can have water or black coffee but otherwise no food or drink x 8 hours before labs) °- Review information provided °- Attend eye doctor annually, dentist every 6 months, work towards or maintain 30 minutes of moderate intensity physical activity at least 5 days per week, and consume a balanced diet °- Return in 1 year for physical °- Contact us for any questions between now and then °

## 2023-02-21 ENCOUNTER — Other Ambulatory Visit: Payer: Self-pay

## 2023-02-21 DIAGNOSIS — Z113 Encounter for screening for infections with a predominantly sexual mode of transmission: Secondary | ICD-10-CM

## 2023-03-26 DIAGNOSIS — M9902 Segmental and somatic dysfunction of thoracic region: Secondary | ICD-10-CM | POA: Diagnosis not present

## 2023-03-26 DIAGNOSIS — M9903 Segmental and somatic dysfunction of lumbar region: Secondary | ICD-10-CM | POA: Diagnosis not present

## 2023-03-26 DIAGNOSIS — M5441 Lumbago with sciatica, right side: Secondary | ICD-10-CM | POA: Diagnosis not present

## 2023-03-26 DIAGNOSIS — M9905 Segmental and somatic dysfunction of pelvic region: Secondary | ICD-10-CM | POA: Diagnosis not present

## 2023-03-26 DIAGNOSIS — M955 Acquired deformity of pelvis: Secondary | ICD-10-CM | POA: Diagnosis not present

## 2023-03-26 DIAGNOSIS — M546 Pain in thoracic spine: Secondary | ICD-10-CM | POA: Diagnosis not present

## 2023-03-26 DIAGNOSIS — M9901 Segmental and somatic dysfunction of cervical region: Secondary | ICD-10-CM | POA: Diagnosis not present

## 2023-03-26 DIAGNOSIS — M531 Cervicobrachial syndrome: Secondary | ICD-10-CM | POA: Diagnosis not present

## 2023-04-17 ENCOUNTER — Ambulatory Visit: Payer: 59 | Admitting: Certified Nurse Midwife

## 2023-04-30 DIAGNOSIS — M5441 Lumbago with sciatica, right side: Secondary | ICD-10-CM | POA: Diagnosis not present

## 2023-04-30 DIAGNOSIS — M9903 Segmental and somatic dysfunction of lumbar region: Secondary | ICD-10-CM | POA: Diagnosis not present

## 2023-04-30 DIAGNOSIS — M9902 Segmental and somatic dysfunction of thoracic region: Secondary | ICD-10-CM | POA: Diagnosis not present

## 2023-04-30 DIAGNOSIS — M955 Acquired deformity of pelvis: Secondary | ICD-10-CM | POA: Diagnosis not present

## 2023-04-30 DIAGNOSIS — M546 Pain in thoracic spine: Secondary | ICD-10-CM | POA: Diagnosis not present

## 2023-04-30 DIAGNOSIS — M531 Cervicobrachial syndrome: Secondary | ICD-10-CM | POA: Diagnosis not present

## 2023-04-30 DIAGNOSIS — M9901 Segmental and somatic dysfunction of cervical region: Secondary | ICD-10-CM | POA: Diagnosis not present

## 2023-04-30 DIAGNOSIS — M9905 Segmental and somatic dysfunction of pelvic region: Secondary | ICD-10-CM | POA: Diagnosis not present

## 2023-05-15 ENCOUNTER — Other Ambulatory Visit (HOSPITAL_COMMUNITY)
Admission: RE | Admit: 2023-05-15 | Discharge: 2023-05-15 | Disposition: A | Discharge status: Home / Self Care | Payer: 59 | Source: Ambulatory Visit | Attending: Certified Nurse Midwife | Admitting: Certified Nurse Midwife

## 2023-05-15 ENCOUNTER — Ambulatory Visit (INDEPENDENT_AMBULATORY_CARE_PROVIDER_SITE_OTHER): Payer: 59 | Admitting: Certified Nurse Midwife

## 2023-05-15 ENCOUNTER — Encounter: Payer: Self-pay | Admitting: Certified Nurse Midwife

## 2023-05-15 VITALS — BP 110/72 | Ht 66.0 in | Wt 150.0 lb

## 2023-05-15 DIAGNOSIS — Z113 Encounter for screening for infections with a predominantly sexual mode of transmission: Secondary | ICD-10-CM | POA: Diagnosis present

## 2023-05-15 DIAGNOSIS — Z114 Encounter for screening for human immunodeficiency virus [HIV]: Secondary | ICD-10-CM | POA: Diagnosis not present

## 2023-05-15 DIAGNOSIS — Z124 Encounter for screening for malignant neoplasm of cervix: Secondary | ICD-10-CM | POA: Diagnosis present

## 2023-05-15 DIAGNOSIS — Z01419 Encounter for gynecological examination (general) (routine) without abnormal findings: Secondary | ICD-10-CM | POA: Diagnosis not present

## 2023-05-15 DIAGNOSIS — Z1151 Encounter for screening for human papillomavirus (HPV): Secondary | ICD-10-CM

## 2023-05-15 DIAGNOSIS — R87811 Vaginal high risk human papillomavirus (HPV) DNA test positive: Secondary | ICD-10-CM | POA: Diagnosis not present

## 2023-05-15 DIAGNOSIS — Z0000 Encounter for general adult medical examination without abnormal findings: Secondary | ICD-10-CM

## 2023-05-15 NOTE — Progress Notes (Signed)
ANNUAL EXAM Patient name: Tammy Pena MRN [id number]  Date of birth: Apr 04, 1996 Chief Complaint:   Annual Exam  History of Present Illness:   Tammy Pena is a 27 y.o. G0P0000 Caucasian female being seen today for a routine annual exam.  Current complaints: denies concerns, desires STI screening.  Patient's last menstrual period was 05/10/2023.   Upstream - 05/15/23 1344       Pregnancy Intention Screening   Does the patient want to become pregnant in the next year? No    Does the patient's partner want to become pregnant in the next year? No    Would the patient like to discuss contraceptive options today? No      Contraception Wrap Up   Current Method No Contraceptive Precautions    Contraception Counseling Provided No    How was the end contraceptive method provided? N/A            The pregnancy intention screening data noted above was reviewed. Potential methods of contraception were discussed. The patient elected to proceed with Condoms.      Component Value Date/Time   DIAGPAP  07/17/2021 1143    - Negative for intraepithelial lesion or malignancy (NILM)   DIAGPAP  05/02/2018 0000    NEGATIVE FOR INTRAEPITHELIAL LESIONS OR MALIGNANCY.   ADEQPAP  07/17/2021 1143    Satisfactory for evaluation; transformation zone component PRESENT.   ADEQPAP  05/02/2018 0000    Satisfactory for evaluation  endocervical/transformation zone component PRESENT.   Last mammogram: n/a. Results were: N/A. Family h/o breast cancer: no Last colonoscopy: n/a. Results were: N/A. Family h/o colorectal cancer: no     02/14/2023   10:50 AM 02/11/2023    1:54 PM 12/24/2017    8:10 AM 05/18/2016    9:56 AM 03/09/2016    2:26 PM  Depression screen PHQ 2/9  Decreased Interest 1 1 0 0 2  Down, Depressed, Hopeless 1 1 0 0 2  PHQ - 2 Score 2 2 0 0 4  Altered sleeping 2 2 0  2  Tired, decreased energy 1 1 0  3  Change in appetite 0 0 0  0  Feeling bad or failure about yourself  1 1 0  3   Trouble concentrating 1 1 0  1  Moving slowly or fidgety/restless 0 0 0  0  Suicidal thoughts 1 2 0  0  PHQ-9 Score 8 9 0  13  Difficult doing work/chores  Not difficult at all Not difficult at all          02/14/2023   10:50 AM 02/11/2023    1:55 PM 03/09/2016    2:27 PM  GAD 7 : Generalized Anxiety Score  Nervous, Anxious, on Edge  1 3  Control/stop worrying 1 1 3   Worry too much - different things 1 1 3   Trouble relaxing 0 0 3  Restless 0 0 3  Easily annoyed or irritable 1 1 3   Afraid - awful might happen 1 1 2   Total GAD 7 Score  5 20  Anxiety Difficulty Not difficult at all Not difficult at all Very difficult     Review of Systems:   Pertinent items are noted in HPI Denies any headaches, blurred vision, fatigue, shortness of breath, chest pain, abdominal pain, abnormal vaginal discharge/itching/odor/irritation, problems with periods, bowel movements, urination, or intercourse unless otherwise stated above. Pertinent History Reviewed:  Reviewed past medical,surgical, social and family history.  Reviewed problem list, medications and  allergies. Physical Assessment:   Vitals:   05/15/23 1343  BP: 110/72  Weight: 150 lb (68 kg)  Height: 5\' 6"  (1.676 m)  Body mass index is 24.21 kg/m.       Physical Exam Vitals reviewed.  Constitutional:      Appearance: Normal appearance.  HENT:     Head: Normocephalic.  Neck:     Thyroid: No thyroid mass or thyromegaly.  Cardiovascular:     Rate and Rhythm: Normal rate and regular rhythm.     Heart sounds: Normal heart sounds.  Pulmonary:     Effort: Pulmonary effort is normal.     Breath sounds: Normal breath sounds.  Chest:  Breasts:    Tanner Score is 5.     Right: Normal.     Left: Normal.  Abdominal:     General: Abdomen is flat.     Palpations: Abdomen is soft.     Tenderness: There is no abdominal tenderness.  Genitourinary:    General: Normal vulva.     Vagina: Normal.     Cervix: Normal.  Musculoskeletal:      Cervical back: Neck supple. No tenderness.  Skin:    General: Skin is warm and dry.  Neurological:     General: No focal deficit present.     Mental Status: She is alert and oriented to person, place, and time.  Psychiatric:        Mood and Affect: Mood normal.        Behavior: Behavior normal.      No results found for this or any previous visit (from the past 24 hour(s)).  Assessment & Plan:  1) Well-Woman Exam: Pap collected. Health promotion reviewed.  2) STI screening  Mammogram: @ 28yo, or sooner if problems Colonoscopy: @ 28yo, or sooner if problems  Orders Placed This Encounter  Procedures   RPR   HIV Antibody (routine testing w rflx)   Hepatitis C antibody   Hepatitis B surface antigen    Meds: No orders of the defined types were placed in this encounter.   Follow-up: Return in 1 year (on 05/14/2024).  Dominica Severin, CNM 05/15/2023 4:46 PM

## 2023-05-15 NOTE — Patient Instructions (Signed)

## 2023-05-16 ENCOUNTER — Encounter: Payer: Self-pay | Admitting: Family Medicine

## 2023-05-16 ENCOUNTER — Ambulatory Visit (INDEPENDENT_AMBULATORY_CARE_PROVIDER_SITE_OTHER): Payer: 59 | Admitting: Family Medicine

## 2023-05-16 VITALS — BP 110/78 | HR 94 | Ht 66.0 in | Wt 150.0 lb

## 2023-05-16 DIAGNOSIS — R220 Localized swelling, mass and lump, head: Secondary | ICD-10-CM | POA: Diagnosis not present

## 2023-05-16 DIAGNOSIS — R21 Rash and other nonspecific skin eruption: Secondary | ICD-10-CM

## 2023-05-16 LAB — RPR: RPR Ser Ql: NONREACTIVE

## 2023-05-16 LAB — HEPATITIS B SURFACE ANTIGEN: Hepatitis B Surface Ag: NEGATIVE

## 2023-05-16 LAB — HEPATITIS C ANTIBODY: Hep C Virus Ab: NONREACTIVE

## 2023-05-16 LAB — HIV ANTIBODY (ROUTINE TESTING W REFLEX): HIV Screen 4th Generation wRfx: NONREACTIVE

## 2023-05-16 MED ORDER — METHYLPREDNISOLONE 4 MG PO TBPK: ORAL_TABLET | ORAL | 0 refills | Status: DC

## 2023-05-16 NOTE — Assessment & Plan Note (Addendum)
2-3-day history of general facial swelling, noted primarily about the mouth, denies any trouble swallowing, throat pain, fevers, chills, congestion, shortness of air, rash, itching, etc.  Of note, she does state that preceding the symptoms she had worsening oral ulcerations that she has had previously, additionally had returned from travel to [place].  She denies any new exposures from a food, drink, soap, cleanser, fragrance, detergent, etc. standpoint.  Denies any similar symptoms in the past.  HEENT examination is benign save for mild generalized swelling, morsicatio buccarum, no lesions otherwise.  Uncertain etiology but contact / exposure related concern vs postviral sequela, will empirically treat with course of oral steroids.

## 2023-05-17 ENCOUNTER — Ambulatory Visit: Payer: 59 | Admitting: Family Medicine

## 2023-05-17 LAB — CERVICOVAGINAL ANCILLARY ONLY
Chlamydia: NEGATIVE
Comment: NEGATIVE
Comment: NORMAL
Neisseria Gonorrhea: NEGATIVE

## 2023-05-20 DIAGNOSIS — R220 Localized swelling, mass and lump, head: Secondary | ICD-10-CM | POA: Insufficient documentation

## 2023-05-20 DIAGNOSIS — L710 Perioral dermatitis: Secondary | ICD-10-CM | POA: Insufficient documentation

## 2023-05-20 NOTE — Progress Notes (Signed)
     Primary Care / Sports Medicine Office Visit  Patient Information:  Patient ID: Tammy Pena, female DOB: [birth year] Age: 27 y.o. MRN: [id number]   Tammy Pena is a pleasant 27 y.o. female presenting with the following:  Chief Complaint  Patient presents with   Facial Swelling    Mouth swelling, 2 days. Unknown reason    Vitals:   05/16/23 1427  BP: 110/78  Pulse: 94  SpO2: 95%   Vitals:   05/16/23 1427  Weight: 150 lb (68 kg)  Height: 5\' 6"  (1.676 m)   Body mass index is 24.21 kg/m.  No results found.   Independent interpretation of notes and tests performed by another provider:   None  Procedures performed:   None  Pertinent History, Exam, Impression, and Recommendations:   Tammy Pena was seen today for facial swelling.  Facial swelling -     methylPREDNISolone; Take for full course per package instructions  Dispense: 21 tablet; Refill: 0  Facial rash Assessment & Plan: 2-3-day history of general facial swelling, noted primarily about the mouth, denies any trouble swallowing, throat pain, fevers, chills, congestion, shortness of air, rash, itching, etc.  Of note, she does state that preceding the symptoms she had worsening oral ulcerations that she has had previously, additionally had returned from travel to [place].  She denies any new exposures from a food, drink, soap, cleanser, fragrance, detergent, etc. standpoint.  Denies any similar symptoms in the past.  HEENT examination is benign save for mild generalized swelling, morsicatio buccarum, no lesions otherwise.  Uncertain etiology but contact / exposure related concern vs postviral sequela, will empirically treat with course of oral steroids.       Orders & Medications Meds ordered this encounter  Medications   methylPREDNISolone (MEDROL DOSEPAK) 4 MG TBPK tablet    Sig: Take for full course per package instructions    Dispense:  21 tablet    Refill:  0   No orders of the defined types  were placed in this encounter.    No follow-ups on file.     Jerrol Banana, MD, [facility]   Primary Care Sports Medicine Primary Care and Sports Medicine at [facility]

## 2023-05-21 LAB — CYTOLOGY - PAP
Comment: NEGATIVE
Diagnosis: UNDETERMINED — AB
High risk HPV: POSITIVE — AB

## 2023-05-22 ENCOUNTER — Encounter: Payer: Self-pay | Admitting: Certified Nurse Midwife

## 2023-05-22 ENCOUNTER — Telehealth: Payer: Self-pay | Admitting: Licensed Practical Nurse

## 2023-05-22 ENCOUNTER — Encounter: Payer: Self-pay | Admitting: Licensed Practical Nurse

## 2023-05-22 ENCOUNTER — Telehealth: Payer: Self-pay

## 2023-05-22 DIAGNOSIS — M5441 Lumbago with sciatica, right side: Secondary | ICD-10-CM | POA: Diagnosis not present

## 2023-05-22 DIAGNOSIS — M9905 Segmental and somatic dysfunction of pelvic region: Secondary | ICD-10-CM | POA: Diagnosis not present

## 2023-05-22 DIAGNOSIS — M9901 Segmental and somatic dysfunction of cervical region: Secondary | ICD-10-CM | POA: Diagnosis not present

## 2023-05-22 DIAGNOSIS — M546 Pain in thoracic spine: Secondary | ICD-10-CM | POA: Diagnosis not present

## 2023-05-22 DIAGNOSIS — M531 Cervicobrachial syndrome: Secondary | ICD-10-CM | POA: Diagnosis not present

## 2023-05-22 DIAGNOSIS — M9903 Segmental and somatic dysfunction of lumbar region: Secondary | ICD-10-CM | POA: Diagnosis not present

## 2023-05-22 DIAGNOSIS — M955 Acquired deformity of pelvis: Secondary | ICD-10-CM | POA: Diagnosis not present

## 2023-05-22 DIAGNOSIS — M9902 Segmental and somatic dysfunction of thoracic region: Secondary | ICD-10-CM | POA: Diagnosis not present

## 2023-05-22 NOTE — Telephone Encounter (Signed)
Can you call pt to discuss pap results, per Dr Valentino Saxon she will need a colpo and I advised pt on this. Pt is upset about the Hpv, I advised her this is common and her body can fight this off itself. Pt had question if she can pass this to her partner or did he give it to her. Please advise

## 2023-05-22 NOTE — Telephone Encounter (Signed)
Thank you :)

## 2023-05-22 NOTE — Telephone Encounter (Signed)
TC to [employer]. Tammy Pena tearful and upset as nobody has called her to review her pap results (ASC-US HPV pos). Reviewed the provider that saw has not been in the office and may have not yet see the results yet.  Revived HPV, pt concerned about transmitting the virus to partners and also worried about the development of cancer.  Recommend colposcopy and using condoms. Pt will call to schedule colpo Carie Caddy, CNM  Domingo Pulse, [facility]  05/22/2023 10:19 AM

## 2023-05-23 NOTE — Telephone Encounter (Signed)
Please advise, Im not sure if this is related to her HPV diagnoses.

## 2023-05-24 NOTE — Telephone Encounter (Signed)
Please advise 

## 2023-06-06 ENCOUNTER — Encounter: Payer: Self-pay | Admitting: Obstetrics & Gynecology

## 2023-06-06 ENCOUNTER — Other Ambulatory Visit (HOSPITAL_COMMUNITY)
Admission: RE | Admit: 2023-06-06 | Discharge: 2023-06-06 | Disposition: A | Discharge status: Home / Self Care | Payer: 59 | Source: Ambulatory Visit | Attending: Obstetrics & Gynecology | Admitting: Obstetrics & Gynecology

## 2023-06-06 ENCOUNTER — Ambulatory Visit (INDEPENDENT_AMBULATORY_CARE_PROVIDER_SITE_OTHER): Payer: 59 | Admitting: Obstetrics & Gynecology

## 2023-06-06 VITALS — BP 107/68 | HR 87 | Ht 66.0 in | Wt 150.0 lb

## 2023-06-06 DIAGNOSIS — R87610 Atypical squamous cells of undetermined significance on cytologic smear of cervix (ASC-US): Secondary | ICD-10-CM | POA: Insufficient documentation

## 2023-06-06 DIAGNOSIS — B977 Papillomavirus as the cause of diseases classified elsewhere: Secondary | ICD-10-CM

## 2023-06-06 DIAGNOSIS — Z3202 Encounter for pregnancy test, result negative: Secondary | ICD-10-CM

## 2023-06-06 LAB — POCT URINE PREGNANCY: Preg Test, Ur: NEGATIVE

## 2023-06-06 NOTE — Progress Notes (Signed)
   Established Patient Office Visit  Subjective   Patient ID: Tammy Pena, female    DOB: Dec 01, 1995  Age: 27 y.o. MRN: [id number]  Chief Complaint  Patient presents with   Colposcopy    HPI   27 yo single G0 here for a colpo due to pap 05/2023 that showed ASCUS + HR HPV. She reports that this is her first abnormal pap smear. She had Gardasil in the past in high school. She uses condoms faithfully. She had negative STI testing 05/2023.  Objective:     BP 107/68   Pulse 87   Ht 5\' 6"  (1.676 m)   Wt 150 lb (68 kg)   LMP 06/06/2023   BMI 24.21 kg/m    Physical Exam  Well nourished, well hydrated White female, no apparent distress She is ambulating and conversing normally. UPT negative, consent signed, time out done Cervix prepped with acetic acid. Transformation zone seen in its entirety. Colpo adequate. Ectocervix entirely normal. ECC obtained. She tolerated the procedure well.  Assessment & Plan:   Problem List Items Addressed This Visit   None Visit Diagnoses     Atypical squamous cells of undetermined significance on cytologic smear of cervix (ASC-US)    -  Primary   Relevant Orders   POCT urine pregnancy (Completed)   HPV (human papilloma virus) infection       Relevant Orders   POCT urine pregnancy (Completed)      Await pathology. I explained that if ECC is negative, then rec repeat pap in 1 year. If abnormal, rec LEEP.    Allie Bossier, MD

## 2023-06-10 LAB — SURGICAL PATHOLOGY

## 2023-06-11 ENCOUNTER — Ambulatory Visit (INDEPENDENT_AMBULATORY_CARE_PROVIDER_SITE_OTHER): Payer: 59 | Admitting: Family Medicine

## 2023-06-11 ENCOUNTER — Encounter: Payer: Self-pay | Admitting: Obstetrics & Gynecology

## 2023-06-11 VITALS — BP 122/60 | HR 80 | Ht 66.0 in | Wt 156.0 lb

## 2023-06-11 DIAGNOSIS — L710 Perioral dermatitis: Secondary | ICD-10-CM | POA: Diagnosis not present

## 2023-06-11 MED ORDER — VALACYCLOVIR HCL 1 G PO TABS: 1000.0000 mg | ORAL_TABLET | Freq: Two times a day (BID) | ORAL | 0 refills | Status: AC

## 2023-06-11 NOTE — Patient Instructions (Signed)
-   Start antihistamine daily (Claritin, Zyrtec, etc) - Start antiviral twice daily x 7-10 days (contact us at that time if needing additional medication for full clearance) - Start B-complex vitamins - Contact us if symptoms fail to resolve

## 2023-06-12 ENCOUNTER — Encounter: Payer: Self-pay | Admitting: Family Medicine

## 2023-06-12 NOTE — Assessment & Plan Note (Signed)
Patient returns for follow-up to initial presentation with generalized facial swelling following return from trip to [place], her symptoms were preceded by oropharyngeal ulcerations and she has a prior positive HSV 1 IgG antibody.  Residual findings include perioral irritation and a degree of angular cheilitis, did slight improvement in response from oral prednisone which was prescribed last visit.  Examination with benign oropharyngeal findings, mild angular colitis noted, subtle perioral skin texture changes without any cracking, breakdown, ulcerations.  Differential to include resolving HSV 1 reactivation, nutritional deficiency, irritant/allergic contact dermatitis trial related infectious etiology, and autoimmune conditions.   Plan: - Start daily antihistamine - Start valacyclovir course - Start daily B complex vitamin - If symptoms persist despite the above, contact our office to coordinate referral to Dermatology

## 2023-06-12 NOTE — Progress Notes (Signed)
     Primary Care / Sports Medicine Office Visit  Patient Information:  Patient ID: Tammy Pena, female DOB: [birth year] Age: 27 y.o. MRN: [id number]   Tammy Pena is a pleasant 27 y.o. female presenting with the following:  Chief Complaint  Patient presents with   Mouth Lesions    Outside, bottom lip "chapped"    Vitals:   06/11/23 1540  BP: 122/60  Pulse: 80  SpO2: 99%   Vitals:   06/11/23 1540  Weight: 156 lb (70.8 kg)  Height: 5\' 6"  (1.676 m)   Body mass index is 25.18 kg/m.  No results found.   Independent interpretation of notes and tests performed by another provider:   None  Procedures performed:   None  Pertinent History, Exam, Impression, and Recommendations:   Problem List Items Addressed This Visit       Digestive   Perioral dermatitis - Primary    Patient returns for follow-up to initial presentation with generalized facial swelling following return from trip to [place], her symptoms were preceded by oropharyngeal ulcerations and she has a prior positive HSV 1 IgG antibody.  Residual findings include perioral irritation and a degree of angular cheilitis, did slight improvement in response from oral prednisone which was prescribed last visit.  Examination with benign oropharyngeal findings, mild angular colitis noted, subtle perioral skin texture changes without any cracking, breakdown, ulcerations.  Differential to include resolving HSV 1 reactivation, nutritional deficiency, irritant/allergic contact dermatitis trial related infectious etiology, and autoimmune conditions.   Plan: - Start daily antihistamine - Start valacyclovir course - Start daily B complex vitamin - If symptoms persist despite the above, contact our office to coordinate referral to Dermatology        Orders & Medications Medications:  Meds ordered this encounter  Medications   valACYclovir (VALTREX) 1000 MG tablet    Sig: Take 1 tablet (1,000 mg total) by mouth 2  (two) times daily for 10 days.    Dispense:  20 tablet    Refill:  0   No orders of the defined types were placed in this encounter.    No follow-ups on file.     Jerrol Banana, MD, [facility]   Primary Care Sports Medicine Primary Care and Sports Medicine at [facility]

## 2023-06-17 ENCOUNTER — Other Ambulatory Visit: Payer: Self-pay

## 2023-06-17 ENCOUNTER — Encounter: Payer: Self-pay | Admitting: Family Medicine

## 2023-06-17 NOTE — Telephone Encounter (Signed)
Please review.  KP

## 2023-06-17 NOTE — Telephone Encounter (Signed)
Can you send her referral to [facility] per her request.

## 2023-06-20 NOTE — Telephone Encounter (Signed)
Please advise 

## 2023-06-20 NOTE — Telephone Encounter (Signed)
Please review.  KP

## 2023-06-20 NOTE — Telephone Encounter (Signed)
Pt response.  KP

## 2023-06-24 DIAGNOSIS — M9902 Segmental and somatic dysfunction of thoracic region: Secondary | ICD-10-CM | POA: Diagnosis not present

## 2023-06-24 DIAGNOSIS — M546 Pain in thoracic spine: Secondary | ICD-10-CM | POA: Diagnosis not present

## 2023-06-24 DIAGNOSIS — M9905 Segmental and somatic dysfunction of pelvic region: Secondary | ICD-10-CM | POA: Diagnosis not present

## 2023-06-24 DIAGNOSIS — M9901 Segmental and somatic dysfunction of cervical region: Secondary | ICD-10-CM | POA: Diagnosis not present

## 2023-06-24 DIAGNOSIS — M531 Cervicobrachial syndrome: Secondary | ICD-10-CM | POA: Diagnosis not present

## 2023-06-24 DIAGNOSIS — M5441 Lumbago with sciatica, right side: Secondary | ICD-10-CM | POA: Diagnosis not present

## 2023-06-24 DIAGNOSIS — M9903 Segmental and somatic dysfunction of lumbar region: Secondary | ICD-10-CM | POA: Diagnosis not present

## 2023-06-24 DIAGNOSIS — M955 Acquired deformity of pelvis: Secondary | ICD-10-CM | POA: Diagnosis not present

## 2023-07-10 IMAGING — US US PELVIS COMPLETE WITH TRANSVAGINAL
1 series · 14 of 25 positions shown · non-contrast
Comparison: July 28, 2019

CLINICAL DATA: Vaginal bleeding.

EXAM:
TRANSABDOMINAL AND TRANSVAGINAL ULTRASOUND OF PELVIS
TECHNIQUE: Both transabdominal and transvaginal ultrasound examinations of the
pelvis were performed. Transabdominal technique was performed for
global imaging of the pelvis including uterus, ovaries, adnexal
regions, and pelvic cul-de-sac. It was necessary to proceed with
endovaginal exam following the transabdominal exam to visualize the
uterus, endometrium and bilateral ovaries.

[Series 1: us pelvis complete with transvaginal · 0.18mm/px · 14 of 70 slices shown]
[im 1/70]
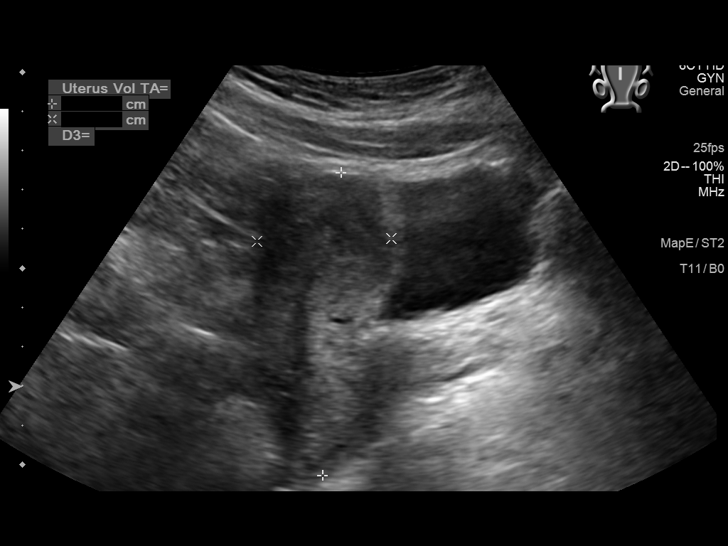
[im 6/70]
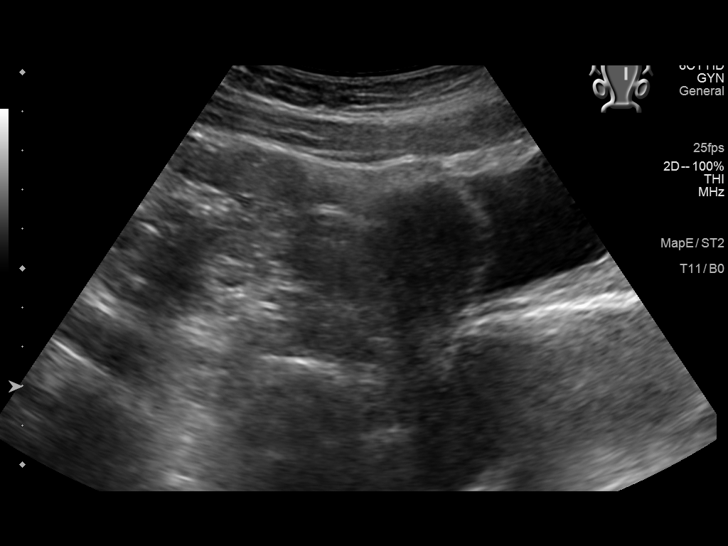
[im 12/70]
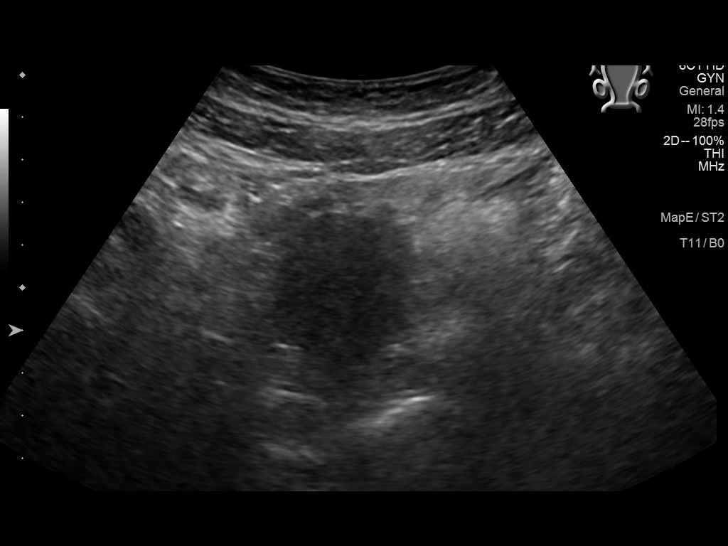
[im 18/70]
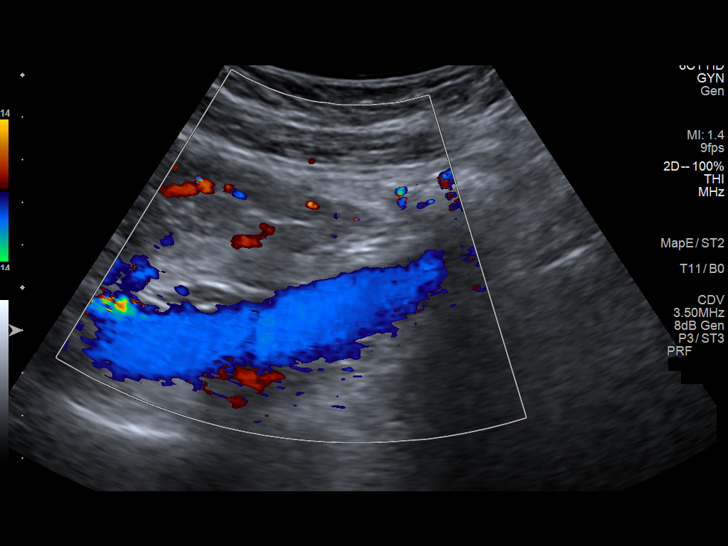
[im 24/70]
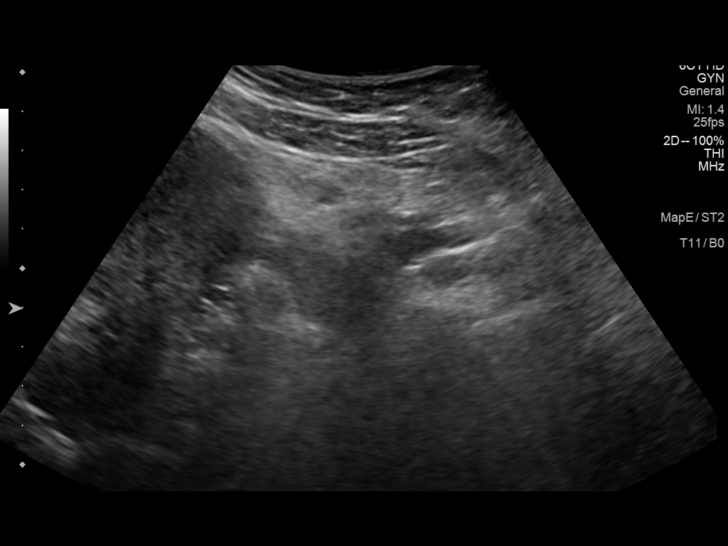
[im 26/70]
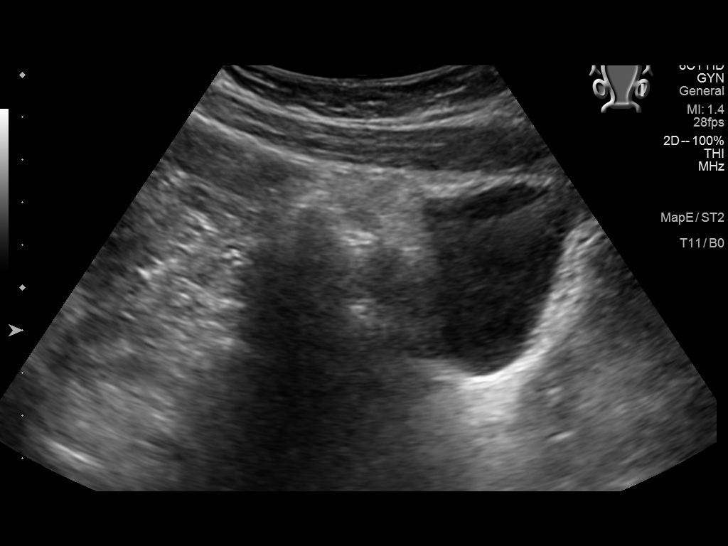
[im 32/70]
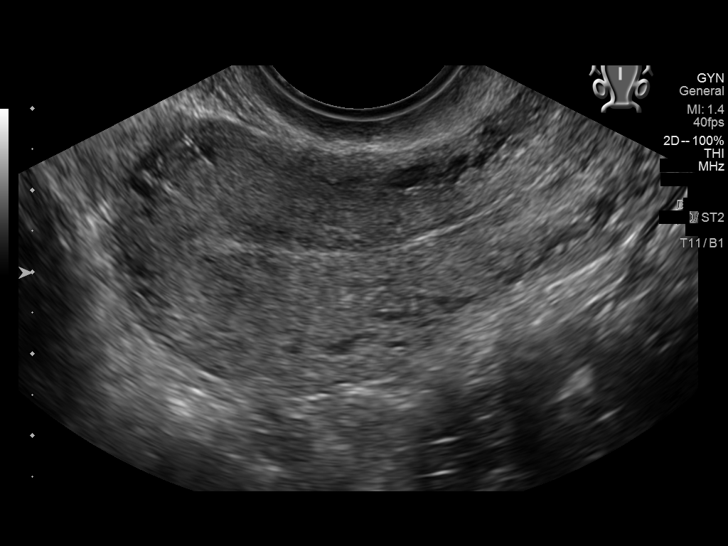
[im 38/70]
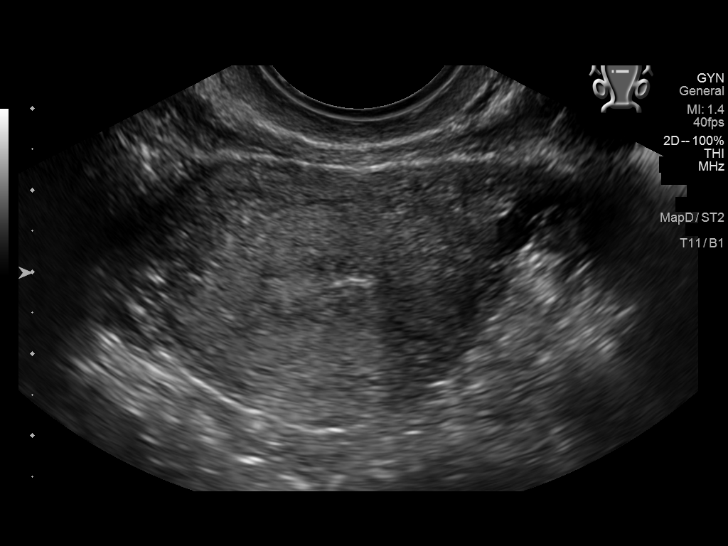
[im 44/70]
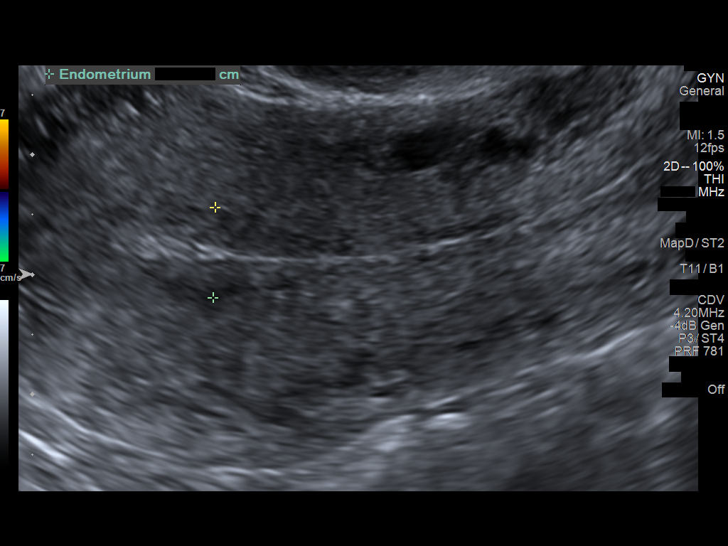
[im 47/70]
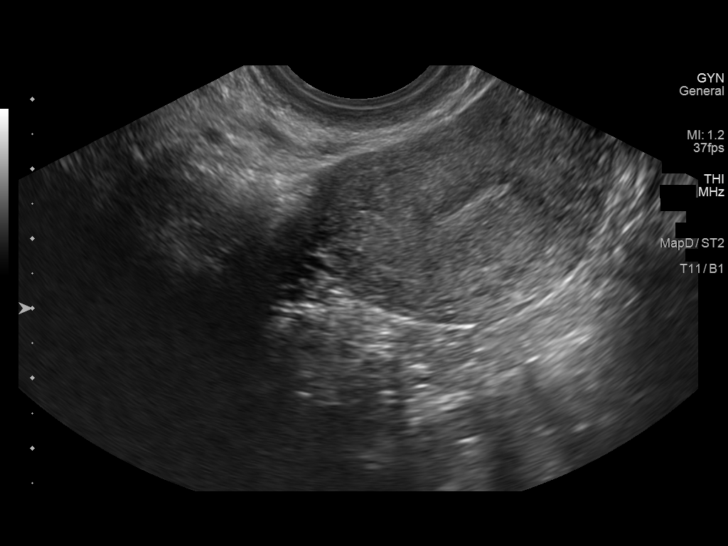
[im 52/70]
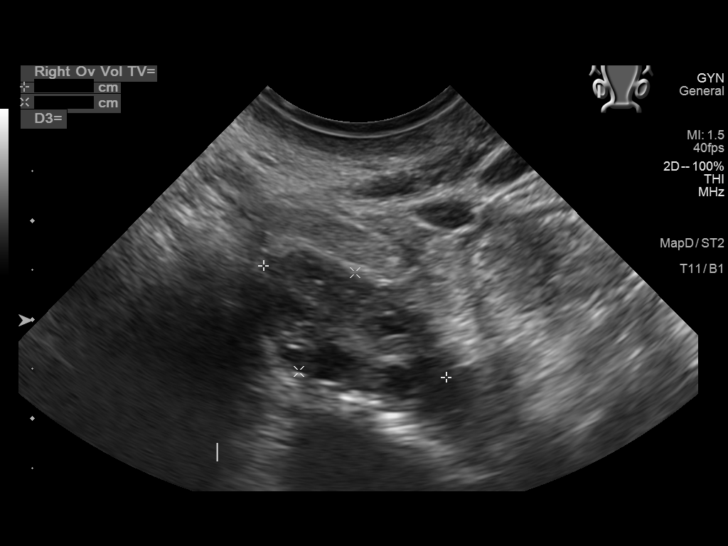
[im 58/70]
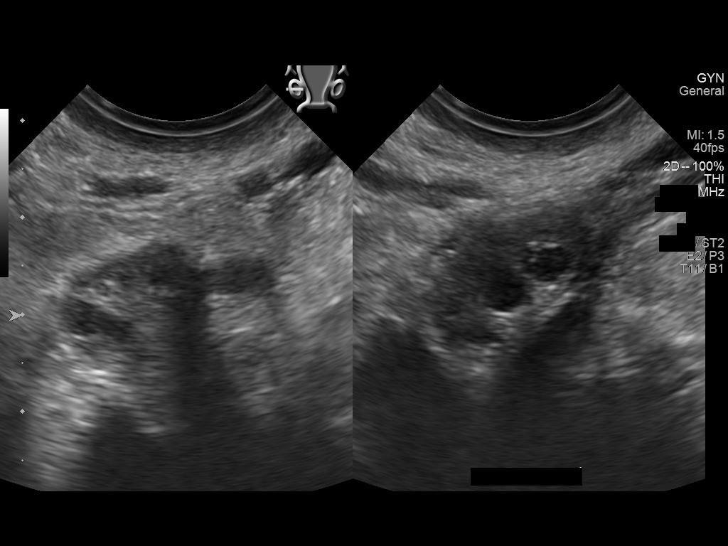
[im 64/70]
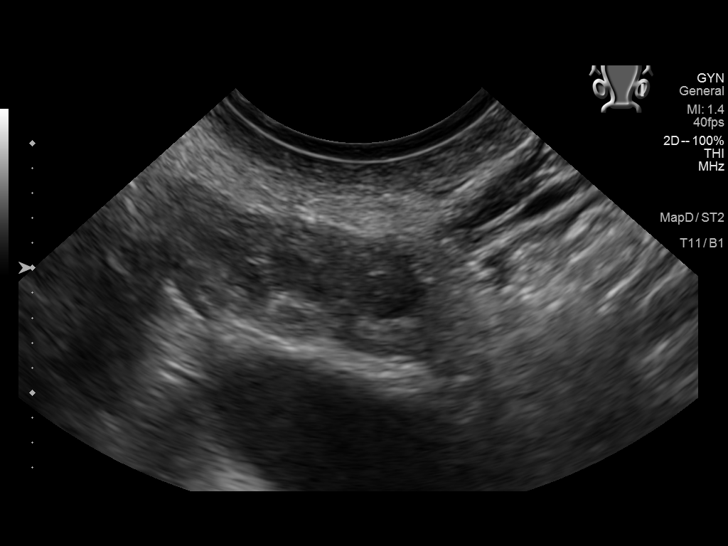
[im 70/70]
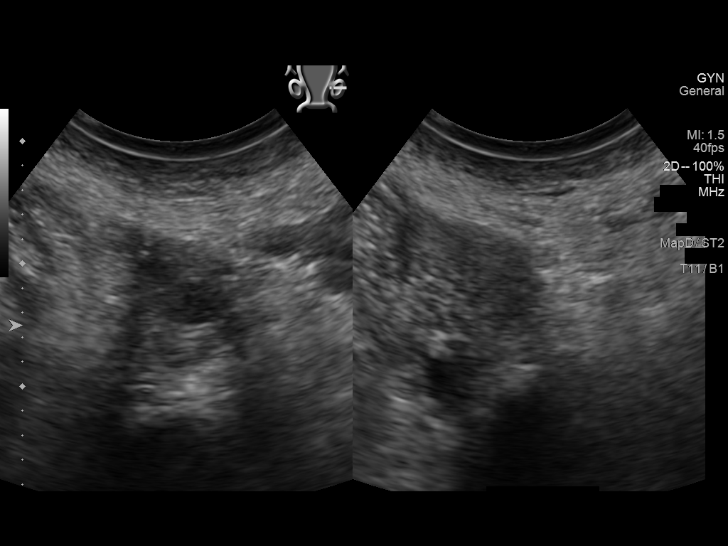

[14 of 25 positions shown; findings below may reference images not displayed]

FINDINGS: Uterus

Measurements: 7.7 cm x 3.4 cm x 4.2 cm = volume: 56.7 mL. No
fibroids or other mass visualized.

Endometrium

Thickness: 7.5 mm.  No focal abnormality visualized.

Right ovary

Measurements: 2.2 cm x 1.2 cm x 1.9 cm = volume: 2.5 mL. Normal
appearance/no adnexal mass.

Left ovary

Measurements: 1.8 cm x 0.7 cm x 1.4 cm = volume: 0.9 mL. Normal
appearance/no adnexal mass.

Other findings

No abnormal free fluid.
IMPRESSION: Normal pelvic ultrasound.

## 2023-07-22 DIAGNOSIS — M9905 Segmental and somatic dysfunction of pelvic region: Secondary | ICD-10-CM | POA: Diagnosis not present

## 2023-07-22 DIAGNOSIS — M9903 Segmental and somatic dysfunction of lumbar region: Secondary | ICD-10-CM | POA: Diagnosis not present

## 2023-07-22 DIAGNOSIS — M531 Cervicobrachial syndrome: Secondary | ICD-10-CM | POA: Diagnosis not present

## 2023-07-22 DIAGNOSIS — M9901 Segmental and somatic dysfunction of cervical region: Secondary | ICD-10-CM | POA: Diagnosis not present

## 2023-07-22 DIAGNOSIS — M955 Acquired deformity of pelvis: Secondary | ICD-10-CM | POA: Diagnosis not present

## 2023-07-22 DIAGNOSIS — M546 Pain in thoracic spine: Secondary | ICD-10-CM | POA: Diagnosis not present

## 2023-07-22 DIAGNOSIS — M9902 Segmental and somatic dysfunction of thoracic region: Secondary | ICD-10-CM | POA: Diagnosis not present

## 2023-07-22 DIAGNOSIS — M5441 Lumbago with sciatica, right side: Secondary | ICD-10-CM | POA: Diagnosis not present

## 2023-08-17 ENCOUNTER — Encounter: Payer: Self-pay | Admitting: Family Medicine

## 2023-08-19 DIAGNOSIS — M9905 Segmental and somatic dysfunction of pelvic region: Secondary | ICD-10-CM | POA: Diagnosis not present

## 2023-08-19 DIAGNOSIS — M531 Cervicobrachial syndrome: Secondary | ICD-10-CM | POA: Diagnosis not present

## 2023-08-19 DIAGNOSIS — M546 Pain in thoracic spine: Secondary | ICD-10-CM | POA: Diagnosis not present

## 2023-08-19 DIAGNOSIS — M9901 Segmental and somatic dysfunction of cervical region: Secondary | ICD-10-CM | POA: Diagnosis not present

## 2023-08-19 DIAGNOSIS — M545 Low back pain, unspecified: Secondary | ICD-10-CM | POA: Diagnosis not present

## 2023-08-19 DIAGNOSIS — M9902 Segmental and somatic dysfunction of thoracic region: Secondary | ICD-10-CM | POA: Diagnosis not present

## 2023-08-19 DIAGNOSIS — M955 Acquired deformity of pelvis: Secondary | ICD-10-CM | POA: Diagnosis not present

## 2023-08-19 DIAGNOSIS — M9903 Segmental and somatic dysfunction of lumbar region: Secondary | ICD-10-CM | POA: Diagnosis not present

## 2023-08-19 NOTE — Telephone Encounter (Signed)
Please schedule an appt.  KP

## 2023-08-19 NOTE — Telephone Encounter (Signed)
Please schedule pt for May for CPE.  KP

## 2023-08-19 NOTE — Telephone Encounter (Signed)
Please review.  KP

## 2023-08-19 NOTE — Telephone Encounter (Signed)
Please schedule pt an appt.  KP

## 2023-08-20 ENCOUNTER — Encounter: Payer: Self-pay | Admitting: Certified Nurse Midwife

## 2023-08-20 ENCOUNTER — Encounter: Payer: Self-pay | Admitting: Obstetrics & Gynecology

## 2023-08-20 ENCOUNTER — Ambulatory Visit (INDEPENDENT_AMBULATORY_CARE_PROVIDER_SITE_OTHER): Payer: 59 | Admitting: Obstetrics & Gynecology

## 2023-08-20 ENCOUNTER — Other Ambulatory Visit (HOSPITAL_COMMUNITY)
Admission: RE | Admit: 2023-08-20 | Discharge: 2023-08-20 | Disposition: A | Discharge status: Home / Self Care | Payer: Medicaid Other | Source: Ambulatory Visit | Attending: Obstetrics & Gynecology | Admitting: Obstetrics & Gynecology

## 2023-08-20 VITALS — Ht 66.0 in | Wt 143.0 lb

## 2023-08-20 DIAGNOSIS — Z113 Encounter for screening for infections with a predominantly sexual mode of transmission: Secondary | ICD-10-CM | POA: Insufficient documentation

## 2023-08-20 NOTE — Progress Notes (Signed)
    GYNECOLOGY PROGRESS NOTE  Subjective:    Patient ID: Tammy Pena, female    DOB: 04/09/1996, 27 y.o.   MRN: [id number]  HPI  Patient is a 27 y.o. single G0 here for STI screening. She had a new partner about a month ago. She is very consistent with condom use. She has no symptoms or concerns  The following portions of the patient's history were reviewed and updated as appropriate: allergies, current medications, past family history, past medical history, past social history, past surgical history, and problem list.  Review of Systems Pertinent items are noted in HPI.  She had a ASCUS + HR HPV pap 05/2023 and a normal colpo and negative ECC following that pap.  Objective:   Height 5\' 6"  (1.676 m), weight 143 lb (64.9 kg), last menstrual period 08/11/2023. Body mass index is 23.08 kg/m.  Well nourished, well hydrated White female, no apparent distress She is ambulating and conversing normally.  Speculum exam all normal.    Assessment:   STI screening per patient request  Plan:   STI testing. She declines HSV IgG as of today

## 2023-08-21 LAB — HEP, RPR, HIV PANEL
HIV Screen 4th Generation wRfx: NONREACTIVE
Hepatitis B Surface Ag: NEGATIVE
RPR Ser Ql: NONREACTIVE

## 2023-08-21 LAB — HEPATITIS C ANTIBODY: Hep C Virus Ab: NONREACTIVE

## 2023-08-21 NOTE — Telephone Encounter (Addendum)
Spoke with patient. Advised Negative/Non Reactive is Normal. Swab results are not back yet. Patient inquired about HPV+ pap in August. Advised that was followed by a colposcopy 06/06/23 which was all benign. She will have repeat pap in 1 year. Patient clarified as of now everything is normal.

## 2023-08-22 LAB — CERVICOVAGINAL ANCILLARY ONLY
Chlamydia: NEGATIVE
Comment: NEGATIVE
Comment: NEGATIVE
Comment: NORMAL
Neisseria Gonorrhea: NEGATIVE
Trichomonas: NEGATIVE

## 2023-11-07 ENCOUNTER — Encounter: Payer: 59 | Admitting: Family Medicine

## 2024-02-19 ENCOUNTER — Encounter: Payer: Self-pay | Admitting: Family Medicine
